# Patient Record
Sex: Female | Born: 1990 | Race: Black or African American | Hispanic: No | Marital: Married | Smoking: Never smoker
Health system: Southern US, Community
[De-identification: ages and names within clinical notes are randomized; demographics above are authoritative.]

## PROBLEM LIST (undated history)

## (undated) DIAGNOSIS — O139 Gestational [pregnancy-induced] hypertension without significant proteinuria, unspecified trimester: Secondary | ICD-10-CM

## (undated) DIAGNOSIS — J302 Other seasonal allergic rhinitis: Secondary | ICD-10-CM

## (undated) HISTORY — PX: NO PAST SURGERIES: SHX2092

## (undated) HISTORY — DX: Gestational (pregnancy-induced) hypertension without significant proteinuria, unspecified trimester: O13.9

---

## 2011-08-13 ENCOUNTER — Emergency Department (INDEPENDENT_AMBULATORY_CARE_PROVIDER_SITE_OTHER)
Admission: EM | Admit: 2011-08-13 | Discharge: 2011-08-13 | Disposition: A | Discharge status: Home / Self Care | Payer: PRIVATE HEALTH INSURANCE | Source: Home / Self Care | Attending: Emergency Medicine | Admitting: Emergency Medicine

## 2011-08-13 ENCOUNTER — Encounter (HOSPITAL_COMMUNITY): Payer: Self-pay | Admitting: *Deleted

## 2011-08-13 DIAGNOSIS — Z331 Pregnant state, incidental: Secondary | ICD-10-CM

## 2011-08-13 HISTORY — DX: Other seasonal allergic rhinitis: J30.2

## 2011-08-13 MED ORDER — PRENATAL VITAMINS (DIS) PO TABS: 1.0000 | ORAL_TABLET | ORAL | Status: DC

## 2011-08-13 NOTE — ED Provider Notes (Signed)
History     CSN: [id number]  Arrival date & time 08/13/11  1348   First MD Initiated Contact with Patient 08/13/11 1428      Chief Complaint  Patient presents with  . Possible Pregnancy    (Consider location/radiation/quality/duration/timing/severity/associated sxs/prior treatment) HPI Comments: I have had a positive test at home and my last menstrual period was March 18, want to confirm that I am pregnant. Somewhat nauseous. No vaginal bleeding and normal, pain.  The history is provided by the patient.    Past Medical History  Diagnosis Date  . Seasonal allergies     History reviewed. No pertinent past surgical history.  History reviewed. No pertinent family history.  History  Substance Use Topics  . Smoking status: Never Smoker   . Smokeless tobacco: Not on file  . Alcohol Use: Yes    OB History    Grav Para Term Preterm Abortions TAB SAB Ect Mult Living                  Review of Systems  Constitutional: Positive for appetite change. Negative for fever and fatigue.  Gastrointestinal: Negative for abdominal pain.  Genitourinary: Negative for dysuria, vaginal bleeding, pelvic pain and dyspareunia.    Allergies  Review of patient's allergies indicates no known allergies.  Home Medications   Current Outpatient Rx  Name Route Sig Dispense Refill  . DIPHENHYDRAMINE HCL 25 MG PO CAPS Oral Take 25 mg by mouth 1 day or 1 dose.    Marland Kitchen PRENATAL VITAMINS (DIS) PO TABS Oral Take 1 tablet by mouth 1 day or 1 dose. 30 tablet 0    BP 137/87  Pulse 88  Temp(Src) 97.1 F (36.2 C) (Oral)  Resp 18  SpO2 100%  LMP 07/07/2011  Physical Exam  Nursing note and vitals reviewed. Constitutional: She appears well-developed and well-nourished. No distress.  Neurological: She is alert.  Psychiatric: She has a normal mood and affect. Her behavior is normal.    ED Course  Procedures (including critical care time)  Labs Reviewed  POCT PREGNANCY, URINE - Abnormal; Notable  for the following:    Preg Test, Ur POSITIVE (*)    All other components within normal limits   No results found.   1. Pregnancy as incidental finding       MDM   Pregnancy first trimester. Patient undecided whether this is a pregnancy she will pursue have been given referrals for both continuity of care pre-and establishment of prenatal care versus early termination of pregnancy.       Jimmie Molly, MD 08/13/11 [phone]

## 2011-08-13 NOTE — Discharge Instructions (Signed)
ABCs of Pregnancy A Antepartum care is very important. Be sure you see your doctor and get prenatal care as soon as you think you are pregnant. At this time, you will be tested for infection, genetic abnormalities and potential problems with you and the pregnancy. This is the time to discuss diet, exercise, work, medications, labor, pain medication during labor and the possibility of a cesarean delivery. Ask any questions that may concern you. It is important to see your doctor regularly throughout your pregnancy. Avoid exposure to toxic substances and chemicals - such as cleaning solvents, lead and mercury, some insecticides, and paint. Pregnant women should avoid exposure to paint fumes, and fumes that cause you to feel ill, dizzy or faint. When possible, it is a good idea to have a pre-pregnancy consultation with your caregiver to begin some important recommendations your caregiver suggests such as, taking folic acid, exercising, quitting smoking, avoiding alcoholic beverages, etc. B Breastfeeding is the healthiest choice for both you and your baby. It has many nutritional benefits for the baby and health benefits for the mother. It also creates a very tight and loving bond between the baby and mother. Talk to your doctor, your family and friends, and your employer about how you choose to feed your baby and how they can support you in your decision. Not all birth defects can be prevented, but a woman can take actions that may increase her chance of having a healthy baby. Many birth defects happen very early in pregnancy, sometimes before a woman even knows she is pregnant. Birth defects or abnormalities of any child in your or the father's family should be discussed with your caregiver. Get a good support bra as your breast size changes. Wear it especially when you exercise and when nursing.  C Celebrate the news of your pregnancy with the your spouse/father and family. Childbirth classes are helpful to  take for you and the spouse/father because it helps to understand what happens during the pregnancy, labor and delivery. Cesarean delivery should be discussed with your doctor so you are prepared for that possibility. The pros and cons of circumcision if it is a boy, should be discussed with your pediatrician. Cigarette smoking during pregnancy can result in low birth weight babies. It has been associated with infertility, miscarriages, tubal pregnancies, infant death (mortality) and poor health (morbidity) in childhood. Additionally, cigarette smoking may cause long-term learning disabilities. If you smoke, you should try to quit before getting pregnant and not smoke during the pregnancy. Secondary smoke may also harm a mother and her developing baby. It is a good idea to ask people to stop smoking around you during your pregnancy and after the baby is born. Extra calcium is necessary when you are pregnant and is found in your prenatal vitamin, in dairy products, green leafy vegetables and in calcium supplements. D A healthy diet according to your current weight and height, along with vitamins and mineral supplements should be discussed with your caregiver. Domestic abuse or violence should be made known to your doctor right away to get the situation corrected. Drink more water when you exercise to keep hydrated. Discomfort of your back and legs usually develops and progresses from the middle of the second trimester through to delivery of the baby. This is because of the enlarging baby and uterus, which may also affect your balance. Do not take illegal drugs. Illegal drugs can seriously harm the baby and you. Drink extra fluids (water is best) throughout pregnancy to help   your body keep up with the increases in your blood volume. Drink at least 6 to 8 glasses of water, fruit juice, or milk each day. A good way to know you are drinking enough fluid is when your urine looks almost like clear water or is very light  yellow.  E Eat healthy to get the nutrients you and your unborn baby need. Your meals should include the five basic food groups. Exercise (30 minutes of light to moderate exercise a day) is important and encouraged during pregnancy, if there are no medical problems or problems with the pregnancy. Exercise that causes discomfort or dizziness should be stopped and reported to your caregiver. Emotions during pregnancy can change from being ecstatic to depression and should be understood by you, your partner and your family. F Fetal screening with ultrasound, amniocentesis and monitoring during pregnancy and labor is common and sometimes necessary. Take 400 micrograms of folic acid daily both before, when possible, and during the first few months of pregnancy to reduce the risk of birth defects of the brain and spine. All women who could possibly become pregnant should take a vitamin with folic acid, every day. It is also important to eat a healthy diet with fortified foods (enriched grain products, including cereals, rice, breads, and pastas) and foods with natural sources of folate (orange juice, green leafy vegetables, beans, peanuts, broccoli, asparagus, peas, and lentils). The father should be involved with all aspects of the pregnancy including, the prenatal care, childbirth classes, labor, delivery, and postpartum time. Fathers may also have emotional concerns about being a father, financial needs, and raising a family. G Genetic testing should be done appropriately. It is important to know your family and the father's history. If there have been problems with pregnancies or birth defects in your family, report these to your doctor. Also, genetic counselors can talk with you about the information you might need in making decisions about having a family. You can call a major medical center in your area for help in finding a board-certified genetic counselor. Genetic testing and counseling should be done  before pregnancy when possible, especially if there is a history of problems in the mother's or father's family. Certain ethnic backgrounds are more at risk for genetic defects. H Get familiar with the hospital where you will be having your baby. Get to know how long it takes to get there, the labor and delivery area, and the hospital procedures. Be sure your medical insurance is accepted there. Get your home ready for the baby including, clothes, the baby's room (when possible), furniture and car seat. Hand washing is important throughout the day, especially after handling raw meat and poultry, changing the baby's diaper or using the bathroom. This can help prevent the spread of many bacteria and viruses that cause infection. Your hair may become dry and thinner, but will return to normal a few weeks after the baby is born. Heartburn is a common problem that can be treated by taking antacids recommended by your caregiver, eating smaller meals 5 or 6 times a day, not drinking liquids when eating, drinking between meals and raising the head of your bed 2 to 3 inches. I Insurance to cover you, the baby, doctor and hospital should be reviewed so that you will be prepared to pay any costs not covered by your insurance plan. If you do not have medical insurance, there are usually clinics and services available for you in your community. Take 30 milligrams of iron during   your pregnancy as prescribed by your doctor to reduce the risk of low red blood cells (anemia) later in pregnancy. All women of childbearing age should eat a diet rich in iron. J There should be a joint effort for the mother, father and any other children to adapt to the pregnancy financially, emotionally, and psychologically during the pregnancy. Join a support group for moms-to-be. Or, join a class on parenting or childbirth. Have the family participate when possible. K Know your limits. Let your caregiver know if you experience any of the  following:   Pain of any kind.   Strong cramps.   You develop a lot of weight in a short period of time (5 pounds in 3 to 5 days).   Vaginal bleeding, leaking of amniotic fluid.   Headache, vision problems.   Dizziness, fainting, shortness of breath.   Chest pain.   Fever of 102 F (38.9 C) or higher.   Gush of clear fluid from your vagina.   Painful urination.   Domestic violence.   Irregular heartbeat (palpitations).   Rapid beating of the heart (tachycardia).   Constant feeling sick to your stomach (nauseous) and vomiting.   Trouble walking, fluid retention (edema).   Muscle weakness.   If your baby has decreased activity.   Persistent diarrhea.   Abnormal vaginal discharge.   Uterine contractions at 20-minute intervals.   Back pain that travels down your leg.  L Learn and practice that what you eat and drink should be in moderation and healthy for you and your baby. Legal drugs such as alcohol and caffeine are important issues for pregnant women. There is no safe amount of alcohol a woman can drink while pregnant. Fetal alcohol syndrome, a disorder characterized by growth retardation, facial abnormalities, and central nervous system dysfunction, is caused by a woman's use of alcohol during pregnancy. Caffeine, found in tea, coffee, soft drinks and chocolate, should also be limited. Be sure to read labels when trying to cut down on caffeine during pregnancy. More than 200 foods, beverages, and over-the-counter medications contain caffeine and have a high salt content! There are coffees and teas that do not contain caffeine. M Medical conditions such as diabetes, epilepsy, and high blood pressure should be treated and kept under control before pregnancy when possible, but especially during pregnancy. Ask your caregiver about any medications that may need to be changed or adjusted during pregnancy. If you are currently taking any medications, ask your caregiver if it  is safe to take them while you are pregnant or before getting pregnant when possible. Also, be sure to discuss any herbs or vitamins you are taking. They are medicines, too! Discuss with your doctor all medications, prescribed and over-the-counter, that you are taking. During your prenatal visit, discuss the medications your doctor may give you during labor and delivery. N Never be afraid to ask your doctor or caregiver questions about your health, the progress of the pregnancy, family problems, stressful situations, and recommendation for a pediatrician, if you do not have one. It is better to take all precautions and discuss any questions or concerns you may have during your office visits. It is a good idea to write down your questions before you visit the doctor. O Over-the-counter cough and cold remedies may contain alcohol or other ingredients that should be avoided during pregnancy. Ask your caregiver about prescription, herbs or over-the-counter medications that you are taking or may consider taking while pregnant.  P Physical activity during pregnancy can   benefit both you and your baby by lessening discomfort and fatigue, providing a sense of well-being, and increasing the likelihood of early recovery after delivery. Light to moderate exercise during pregnancy strengthens the belly (abdominal) and back muscles. This helps improve posture. Practicing yoga, walking, swimming, and cycling on a stationary bicycle are usually safe exercises for pregnant women. Avoid scuba diving, exercise at high altitudes (over 3000 feet), skiing, horseback riding, contact sports, etc. Always check with your doctor before beginning any kind of exercise, especially during pregnancy and especially if you did not exercise before getting pregnant. Q Queasiness, stomach upset and morning sickness are common during pregnancy. Eating a couple of crackers or dry toast before getting out of bed. Foods that you normally love may  make you feel sick to your stomach. You may need to substitute other nutritious foods. Eating 5 or 6 small meals a day instead of 3 large ones may make you feel better. Do not drink with your meals, drink between meals. Questions that you have should be written down and asked during your prenatal visits. R Read about and make plans to baby-proof your home. There are important tips for making your home a safer environment for your baby. Review the tips and make your home safer for you and your baby. Read food labels regarding calories, salt and fat content in the food. S Saunas, hot tubs, and steam rooms should be avoided while you are pregnant. Excessive high heat may be harmful during your pregnancy. Your caregiver will screen and examine you for sexually transmitted diseases and genetic disorders during your prenatal visits. Learn the signs of labor. Sexual relations while pregnant is safe unless there is a medical or pregnancy problem and your caregiver advises against it. T Traveling long distances should be avoided especially in the third trimester of your pregnancy. If you do have to travel out of state, be sure to take a copy of your medical records and medical insurance plan with you. You should not travel long distances without seeing your doctor first. Most airlines will not allow you to travel after 36 weeks of pregnancy. Toxoplasmosis is an infection caused by a parasite that can seriously harm an unborn baby. Avoid eating undercooked meat and handling cat litter. Be sure to wear gloves when gardening. Tingling of the hands and fingers is not unusual and is due to fluid retention. This will go away after the baby is born. U Womb (uterus) size increases during the first trimester. Your kidneys will begin to function more efficiently. This may cause you to feel the need to urinate more often. You may also leak urine when sneezing, coughing or laughing. This is due to the growing uterus pressing  against your bladder, which lies directly in front of and slightly under the uterus during the first few months of pregnancy. If you experience burning along with frequency of urination or bloody urine, be sure to tell your doctor. The size of your uterus in the third trimester may cause a problem with your balance. It is advisable to maintain good posture and avoid wearing high heels during this time. An ultrasound of your baby may be necessary during your pregnancy and is safe for you and your baby. V Vaccinations are an important concern for pregnant women. Get needed vaccines before pregnancy. Center for Disease Control (www.cdc.gov) has clear guidelines for the use of vaccines during pregnancy. Review the list, be sure to discuss it with your doctor. Prenatal vitamins are helpful   and healthy for you and the baby. Do not take extra vitamins except what is recommended. Taking too much of certain vitamins can cause overdose problems. Continuous vomiting should be reported to your caregiver. Varicose veins may appear especially if there is a family history of varicose veins. They should subside after the delivery of the baby. Support hose helps if there is leg discomfort. W Being overweight or underweight during pregnancy may cause problems. Try to get within 15 pounds of your ideal weight before pregnancy. Remember, pregnancy is not a time to be dieting! Do not stop eating or start skipping meals as your weight increases. Both you and your baby need the calories and nutrition you receive from a healthy diet. Be sure to consult with your doctor about your diet. There is a formula and diet plan available depending on whether you are overweight or underweight. Your caregiver or nutritionist can help and advise you if necessary. X Avoid X-rays. If you must have dental work or diagnostic tests, tell your dentist or physician that you are pregnant so that extra care can be taken. X-rays should only be taken when  the risks of not taking them outweigh the risk of taking them. If needed, only the minimum amount of radiation should be used. When X-rays are necessary, protective lead shields should be used to cover areas of the body that are not being X-rayed. Y Your baby loves you. Breastfeeding your baby creates a loving and very close bond between the two of you. Give your baby a healthy environment to live in while you are pregnant. Infants and children require constant care and guidance. Their health and safety should be carefully watched at all times. After the baby is born, rest or take a nap when the baby is sleeping. Z Get your ZZZs. Be sure to get plenty of rest. Resting on your side as often as possible, especially on your left side is advised. It provides the best circulation to your baby and helps reduce swelling. Try taking a nap for 30 to 45 minutes in the afternoon when possible. After the baby is born rest or take a nap when the baby is sleeping. Try elevating your feet for that amount of time when possible. It helps the circulation in your legs and helps reduce swelling.  Most information courtesy of the CDC. Document Released: 04/07/2005 Document Revised: 03/27/2011 Document Reviewed: 12/20/2008 [employer] Patient Information 2012 [employer]. 

## 2011-08-13 NOTE — ED Notes (Signed)
Pt requesting pregnancy test - took home test positive -  wants confirmation - LMP 3/18

## 2011-08-19 ENCOUNTER — Inpatient Hospital Stay (HOSPITAL_COMMUNITY)
Admission: AD | Admit: 2011-08-19 | Discharge: 2011-08-19 | Disposition: A | Discharge status: Home / Self Care | Payer: No Typology Code available for payment source | Source: Ambulatory Visit | Attending: Obstetrics & Gynecology | Admitting: Obstetrics & Gynecology

## 2011-08-19 ENCOUNTER — Encounter (HOSPITAL_COMMUNITY): Payer: Self-pay | Admitting: *Deleted

## 2011-08-19 ENCOUNTER — Inpatient Hospital Stay (HOSPITAL_COMMUNITY): Payer: No Typology Code available for payment source

## 2011-08-19 DIAGNOSIS — R109 Unspecified abdominal pain: Secondary | ICD-10-CM | POA: Insufficient documentation

## 2011-08-19 DIAGNOSIS — O209 Hemorrhage in early pregnancy, unspecified: Secondary | ICD-10-CM | POA: Insufficient documentation

## 2011-08-19 LAB — HCG, QUANTITATIVE, PREGNANCY: hCG, Beta Chain, Quant, S: 6324 m[IU]/mL — ABNORMAL HIGH (ref <5)

## 2011-08-19 LAB — CBC
HCT: 35.3 % — ABNORMAL LOW (ref 36.0–46.0)
Hemoglobin: 11.9 g/dL — ABNORMAL LOW (ref 12.0–15.0)
MCH: 29.9 pg (ref 26.0–34.0)
MCHC: 33.7 g/dL (ref 30.0–36.0)
MCV: 88.7 fL (ref 78.0–100.0)
RDW: 12.1 % (ref 11.5–15.5)

## 2011-08-19 LAB — URINALYSIS, ROUTINE W REFLEX MICROSCOPIC
Bilirubin Urine: NEGATIVE
Glucose, UA: NEGATIVE mg/dL
Ketones, ur: 15 mg/dL — AB
Protein, ur: NEGATIVE mg/dL
pH: 7 (ref 5.0–8.0)

## 2011-08-19 LAB — WET PREP, GENITAL

## 2011-08-19 NOTE — MAU Note (Signed)
Patient state she had a positive pregnancy test at [facility] ED. States she started having cramping and light spotting last night. Has had a white vaginal discharge with a slight odor for a couple of days. Denies any pain at this time. Has not checked for discharge since this am.

## 2011-08-19 NOTE — MAU Note (Signed)
Pt states she started having cramping yesterday ,no vaginal bleeding noted

## 2011-08-19 NOTE — MAU Provider Note (Signed)
History     CSN: [id number]  Arrival date and time: 08/19/11 1455   First Provider Initiated Contact with Patient 08/19/11 1620      Chief Complaint  Patient presents with  . Abdominal Cramping   HPI  Pt is pregnant with LMP 3/18- [redacted]w[redacted]d pregnant.  She complains of pink vaginal discharge and some lower abdominal cramping since yesterday.  She denies UTI symptoms, constipation, or diarrhea.  She has been  Nauseated.  Pt plans care at [facility].  Past Medical History  Diagnosis Date  . Seasonal allergies   . Asthma     Past Surgical History  Procedure Date  . No past surgeries     Family History  Problem Relation Age of Onset  . Hypertension Father   . Diabetes Paternal Grandfather   . Hypertension Paternal Grandfather     History  Substance Use Topics  . Smoking status: Never Smoker   . Smokeless tobacco: Not on file  . Alcohol Use: Yes    Allergies: No Known Allergies  Prescriptions prior to admission  Medication Sig Dispense Refill  . diphenhydrAMINE (BENADRYL) 25 mg capsule Take 25 mg by mouth 1 day or 1 dose.      . Prenatal Vitamins (DIS) TABS Take 1 tablet by mouth 1 day or 1 dose.  30 tablet  0    Review of Systems  Constitutional: Negative for fever.  Respiratory: Negative for cough.   Gastrointestinal: Positive for nausea and abdominal pain. Negative for vomiting, diarrhea and constipation.  Genitourinary: Negative for dysuria, urgency and frequency.   Physical Exam   Blood pressure 126/68, pulse 78, temperature 99.1 F (37.3 C), temperature source Oral, resp. rate 16, height 5' 2.5" (1.588 m), weight 211 lb 9.6 oz (95.981 kg), last menstrual period 07/07/2011, SpO2 100.00%.  Physical Exam  Nursing note and vitals reviewed. Constitutional: She is oriented to person, place, and time. She appears well-developed and well-nourished.  HENT:  Head: Normocephalic.  Eyes: Pupils are equal, round, and reactive to light.  Neck: Normal range of motion. Neck  supple.  Cardiovascular: Normal rate.   Respiratory: Effort normal.  GI: Soft. She exhibits no distension. There is no tenderness. There is no rebound and no guarding.       Pain not replicated with exam- not currently present  Genitourinary:       Scant amount of pink discharge on Q-tip from os; no bleeding noted in vault; uterus NSSC NT; adnexal without palpable enlargement or tender  Musculoskeletal: Normal range of motion.  Neurological: She is alert and oriented to person, place, and time.  Skin: Skin is warm and dry.  Psychiatric: She has a normal mood and affect.    MAU Course  Procedures Results for orders placed during the hospital encounter of 08/19/11 (from the past 24 hour(s))  URINALYSIS, ROUTINE W REFLEX MICROSCOPIC     Status: Abnormal   Collection Time   08/19/11  3:50 PM      Component Value Range   Color, Urine YELLOW  YELLOW    APPearance HAZY (*) CLEAR    Specific Gravity, Urine 1.020  1.005 - 1.030    pH 7.0  5.0 - 8.0    Glucose, UA NEGATIVE  NEGATIVE (mg/dL)   Hgb urine dipstick NEGATIVE  NEGATIVE    Bilirubin Urine NEGATIVE  NEGATIVE    Ketones, ur 15 (*) NEGATIVE (mg/dL)   Protein, ur NEGATIVE  NEGATIVE (mg/dL)   Urobilinogen, UA 1.0  0.0 - 1.0 (mg/dL)  Nitrite NEGATIVE  NEGATIVE    Leukocytes, UA NEGATIVE  NEGATIVE   CBC     Status: Abnormal   Collection Time   08/19/11  4:28 PM      Component Value Range   WBC 7.6  4.0 - 10.5 (K/uL)   RBC 3.98  3.87 - 5.11 (MIL/uL)   Hemoglobin 11.9 (*) 12.0 - 15.0 (g/dL)   HCT 65.7 (*) 84.6 - 46.0 (%)   MCV 88.7  78.0 - 100.0 (fL)   MCH 29.9  26.0 - 34.0 (pg)   MCHC 33.7  30.0 - 36.0 (g/dL)   RDW 96.2  95.2 - 84.1 (%)   Platelets 230  150 - 400 (K/uL)  ABO/RH     Status: Normal (Preliminary result)   Collection Time   08/19/11  4:29 PM      Component Value Range   ABO/RH(D) O POS    WET PREP, GENITAL     Status: Abnormal   Collection Time   08/19/11  4:40 PM      Component Value Range   Yeast Wet Prep HPF  POC NONE SEEN  NONE SEEN    Trich, Wet Prep NONE SEEN  NONE SEEN    Clue Cells Wet Prep HPF POC NONE SEEN  NONE SEEN    WBC, Wet Prep HPF POC FEW (*) NONE SEEN   GC/Chlamydia- pending Clinical Data: Cramps, vaginal discharge, pregnant; no quantitative  beta HCG available for correlation  OBSTETRIC <14 WK [place] AND TRANSVAGINAL OB US  Technique: Both transabdominal and transvaginal ultrasound  examinations were performed for complete evaluation of the  gestation as well as the maternal uterus, adnexal regions, and  pelvic cul-de-sac. Transvaginal technique was performed to assess  early pregnancy.  Comparison: None  Intrauterine gestational sac: Visualized/normal in shape.  Yolk sac: Present  Embryo: Not identified  Cardiac Activity: N/A  Heart Rate: N/A bpm  MSD: 8.9 mm 5 w 4 d  [place] EDC: 04/16/2029  Maternal uterus/adnexae:  No subchorionic hemorrhage.  Left ovary measures 2.5 x 4.5 x 2.8 cm in size and contains a small  collapsed versus hemorrhagic corpus luteal cyst.  Right ovary normal size and morphology, 1.5 x 3.3 x 1.5 cm.  No adnexal masses or free pelvic fluid otherwise identified.  IMPRESSION:  Gestational sac identified within the uterus containing a small  yolk sac, though no fetal pole is identified to establish fetal  viability.  Otherwise negative exam.  Original Report Authenticated By: Lollie Marrow,  Will f/u with viability ultrasound in 10 days Results for orders placed during the hospital encounter of 08/19/11 (from the past 24 hour(s))  URINALYSIS, ROUTINE W REFLEX MICROSCOPIC     Status: Abnormal   Collection Time   08/19/11  3:50 PM      Component Value Range   Color, Urine YELLOW  YELLOW    APPearance HAZY (*) CLEAR    Specific Gravity, Urine 1.020  1.005 - 1.030    pH 7.0  5.0 - 8.0    Glucose, UA NEGATIVE  NEGATIVE (mg/dL)   Hgb urine dipstick NEGATIVE  NEGATIVE    Bilirubin Urine NEGATIVE  NEGATIVE    Ketones, ur 15 (*) NEGATIVE (mg/dL)   Protein,  ur NEGATIVE  NEGATIVE (mg/dL)   Urobilinogen, UA 1.0  0.0 - 1.0 (mg/dL)   Nitrite NEGATIVE  NEGATIVE    Leukocytes, UA NEGATIVE  NEGATIVE   CBC     Status: Abnormal   Collection Time   08/19/11  4:28 PM      Component Value Range   WBC 7.6  4.0 - 10.5 (K/uL)   RBC 3.98  3.87 - 5.11 (MIL/uL)   Hemoglobin 11.9 (*) 12.0 - 15.0 (g/dL)   HCT 16.1 (*) 09.6 - 46.0 (%)   MCV 88.7  78.0 - 100.0 (fL)   MCH 29.9  26.0 - 34.0 (pg)   MCHC 33.7  30.0 - 36.0 (g/dL)   RDW 04.5  40.9 - 81.1 (%)   Platelets 230  150 - 400 (K/uL)  ABO/RH     Status: Normal   Collection Time   08/19/11  4:29 PM      Component Value Range   ABO/RH(D) O POS    WET PREP, GENITAL     Status: Abnormal   Collection Time   08/19/11  4:40 PM      Component Value Range   Yeast Wet Prep HPF POC NONE SEEN  NONE SEEN    Trich, Wet Prep NONE SEEN  NONE SEEN    Clue Cells Wet Prep HPF POC NONE SEEN  NONE SEEN    WBC, Wet Prep HPF POC FEW (*) NONE SEEN   HCG, QUANTITATIVE, PREGNANCY     Status: Abnormal   Collection Time   08/19/11  5:00 PM      Component Value Range   hCG, Beta Chain, Quant, S 6324 (*) <5 (mIU/mL)   Assessment and Plan  bleeding in early pregnancyLINEBERRY,Lotus Santillo 08/19/2011, 4:21 PM  Will f/u for ultrasound in 10 days for viability

## 2011-08-19 NOTE — Progress Notes (Signed)
Speculum exam done cultures obtained 

## 2011-08-20 LAB — GC/CHLAMYDIA PROBE AMP, GENITAL
Chlamydia, DNA Probe: NEGATIVE
GC Probe Amp, Genital: NEGATIVE

## 2011-09-30 LAB — OB RESULTS CONSOLE RUBELLA ANTIBODY, IGM: Rubella: IMMUNE

## 2012-03-17 ENCOUNTER — Other Ambulatory Visit (HOSPITAL_COMMUNITY): Payer: Self-pay | Admitting: Obstetrics and Gynecology

## 2012-03-17 DIAGNOSIS — O3660X0 Maternal care for excessive fetal growth, unspecified trimester, not applicable or unspecified: Secondary | ICD-10-CM

## 2012-03-24 ENCOUNTER — Ambulatory Visit (HOSPITAL_COMMUNITY)
Admission: RE | Admit: 2012-03-24 | Discharge: 2012-03-24 | Disposition: A | Discharge status: Home / Self Care | Payer: No Typology Code available for payment source | Source: Ambulatory Visit | Attending: Obstetrics and Gynecology | Admitting: Obstetrics and Gynecology

## 2012-03-24 ENCOUNTER — Encounter (HOSPITAL_COMMUNITY): Payer: Self-pay

## 2012-03-24 DIAGNOSIS — O3660X0 Maternal care for excessive fetal growth, unspecified trimester, not applicable or unspecified: Secondary | ICD-10-CM | POA: Insufficient documentation

## 2012-03-24 DIAGNOSIS — Z3689 Encounter for other specified antenatal screening: Secondary | ICD-10-CM | POA: Insufficient documentation

## 2012-03-28 ENCOUNTER — Inpatient Hospital Stay (HOSPITAL_COMMUNITY)
Admission: AD | Admit: 2012-03-28 | Discharge: 2012-03-28 | Disposition: A | Discharge status: Home / Self Care | Payer: Medicaid Other | Source: Ambulatory Visit | Attending: Obstetrics and Gynecology | Admitting: Obstetrics and Gynecology

## 2012-03-28 ENCOUNTER — Encounter (HOSPITAL_COMMUNITY): Payer: Self-pay | Admitting: *Deleted

## 2012-03-28 DIAGNOSIS — O99891 Other specified diseases and conditions complicating pregnancy: Secondary | ICD-10-CM | POA: Insufficient documentation

## 2012-03-28 LAB — POCT FERN TEST: POCT Fern Test: NEGATIVE

## 2012-03-28 NOTE — Progress Notes (Signed)
Dr Tenny Craw notified of pt's fern results, VE and fhr pattern, orders received

## 2012-03-28 NOTE — MAU Provider Note (Signed)
  History     CSN: [id number]  Arrival date and time: 03/28/12 1425   None     Chief Complaint  Patient presents with  . Rupture of Membranes   HPI [name] is 21 y.o. G1P0 [redacted]w[redacted]d weeks presenting with ?rupture of membranes.  States she had a gush of fluid this am.  Denies contractions, vaginal bleeding.  + Fetal movement.  Patient of [facility].    Past Medical History  Diagnosis Date  . Seasonal allergies   . Asthma     Past Surgical History  Procedure Date  . No past surgeries     Family History  Problem Relation Age of Onset  . Hypertension Father   . Diabetes Paternal Grandfather   . Hypertension Paternal Grandfather     History  Substance Use Topics  . Smoking status: Never Smoker   . Smokeless tobacco: Not on file  . Alcohol Use: Yes    Allergies: No Known Allergies  Prescriptions prior to admission  Medication Sig Dispense Refill  . albuterol (PROVENTIL HFA;VENTOLIN HFA) 108 (90 BASE) MCG/ACT inhaler Inhale 2 puffs into the lungs daily as needed. Before exercise or as needed for asthma      . diphenhydrAMINE (BENADRYL) 25 mg capsule Take 25 mg by mouth 2 (two) times daily as needed. For allergies      . Prenatal Vit-Fe Fumarate-FA (PRENATAL MULTIVITAMIN) TABS Take 1 tablet by mouth daily.      . pseudoephedrine (SUDAFED) 30 MG tablet Take 30 mg by mouth every 4 (four) hours as needed. For sinus congestion        Review of Systems  Genitourinary:       ? ROM Neg for vaginal bleeding   Physical Exam   Blood pressure 146/100, pulse 105, temperature 98.2 F (36.8 C), resp. rate 16, height 5\' 6"  (1.676 m), weight 252 lb (114.306 kg), last menstrual period 07/12/2011.  Physical Exam  Constitutional: She is oriented to person, place, and time. She appears well-developed and well-nourished. No distress.  HENT:  Head: Normocephalic.  Neck: Normal range of motion.  Respiratory: Effort normal.  GI: Soft.  Genitourinary:   Vagina-without pooling of fluid, blood, or abnormal appearing vaginal discharge.   CERVICAL EXAM by Ginger, RN 1cm, 50% effaced.  Neurological: She is alert and oriented to person, place, and time.  Skin: Skin is warm and dry.  Psychiatric: She has a normal mood and affect. Her behavior is normal.  FERN- NEGATIVE  MAU Course  Procedures  MDM Ginger to call Dr. Tenny Craw to report exam and fern results.  Assessment and Plan  A;  [redacted]w[redacted]d gestation      Rupture of membranes ruled out with Negative Fern  P:  Per Dr. Fenton Foy M 03/28/2012, 3:23 PM

## 2012-03-28 NOTE — MAU Note (Signed)
Pt states she noticed leakage of fluid starting at 145 today, clear fluid.

## 2012-03-29 ENCOUNTER — Inpatient Hospital Stay (HOSPITAL_COMMUNITY)
Admission: AD | Admit: 2012-03-29 | Discharge: 2012-04-03 | DRG: 765 | Disposition: A | Discharge status: Home / Self Care | Payer: Medicaid Other | Source: Ambulatory Visit | Attending: Obstetrics & Gynecology | Admitting: Obstetrics & Gynecology

## 2012-03-29 ENCOUNTER — Encounter (HOSPITAL_COMMUNITY): Payer: Self-pay | Admitting: *Deleted

## 2012-03-29 DIAGNOSIS — O411090 Infection of amniotic sac and membranes, unspecified, unspecified trimester, not applicable or unspecified: Secondary | ICD-10-CM | POA: Diagnosis present

## 2012-03-29 DIAGNOSIS — O99892 Other specified diseases and conditions complicating childbirth: Secondary | ICD-10-CM | POA: Diagnosis present

## 2012-03-29 DIAGNOSIS — Z22330 Carrier of Group B streptococcus: Secondary | ICD-10-CM

## 2012-03-29 DIAGNOSIS — O4290 Premature rupture of membranes, unspecified as to length of time between rupture and onset of labor, unspecified weeks of gestation: Secondary | ICD-10-CM | POA: Diagnosis present

## 2012-03-29 LAB — CBC
HCT: 31.1 % — ABNORMAL LOW (ref 36.0–46.0)
Hemoglobin: 10.2 g/dL — ABNORMAL LOW (ref 12.0–15.0)
MCH: 29.6 pg (ref 26.0–34.0)
MCHC: 33.4 g/dL (ref 30.0–36.0)
MCHC: 33.1 g/dL (ref 30.0–36.0)
MCV: 88.6 fL (ref 78.0–100.0)
Platelets: 129 10*3/uL — ABNORMAL LOW (ref 150–400)
RDW: 14.2 % (ref 11.5–15.5)
RDW: 14 % (ref 11.5–15.5)
WBC: 10.9 10*3/uL — ABNORMAL HIGH (ref 4.0–10.5)

## 2012-03-29 LAB — COMPREHENSIVE METABOLIC PANEL
ALT: 76 U/L — ABNORMAL HIGH (ref 0–35)
AST: 70 U/L — ABNORMAL HIGH (ref 0–37)
Albumin: 2.8 g/dL — ABNORMAL LOW (ref 3.5–5.2)
Calcium: 8.5 mg/dL (ref 8.4–10.5)
Chloride: 103 mEq/L (ref 96–112)
Creatinine, Ser: 0.61 mg/dL (ref 0.50–1.10)
Sodium: 135 mEq/L (ref 135–145)

## 2012-03-29 LAB — POCT FERN TEST: POCT Fern Test: POSITIVE

## 2012-03-29 LAB — LACTATE DEHYDROGENASE: LDH: 463 U/L — ABNORMAL HIGH (ref 94–250)

## 2012-03-29 LAB — URIC ACID: Uric Acid, Serum: 4.8 mg/dL (ref 2.4–7.0)

## 2012-03-29 LAB — PREPARE RBC (CROSSMATCH)

## 2012-03-29 MED ORDER — ONDANSETRON HCL 4 MG/2ML IJ SOLN: 4.0000 mg | Freq: Four times a day (QID) | INTRAMUSCULAR | Status: DC | PRN

## 2012-03-29 MED ORDER — PENICILLIN G POTASSIUM 5000000 UNITS IJ SOLR
5.0000 10*6.[IU] | Freq: Once | INTRAVENOUS | Status: AC
  Administered 2012-03-29: 5 10*6.[IU] via INTRAVENOUS
  Filled 2012-03-29: qty 5

## 2012-03-29 MED ORDER — MISOPROSTOL 25 MCG QUARTER TABLET
50.0000 ug | ORAL_TABLET | Freq: Once | ORAL | Status: AC
  Administered 2012-03-29: 50 ug via VAGINAL
  Filled 2012-03-29: qty 0.5

## 2012-03-29 MED ORDER — OXYTOCIN 40 UNITS IN LACTATED RINGERS INFUSION - SIMPLE MED: 1.0000 m[IU]/min | INTRAVENOUS | Status: DC

## 2012-03-29 MED ORDER — MISOPROSTOL 25 MCG QUARTER TABLET
25.0000 ug | ORAL_TABLET | ORAL | Status: DC
  Administered 2012-03-29 – 2012-03-30 (×2): 25 ug via VAGINAL
  Filled 2012-03-29: qty 0.25
  Filled 2012-03-29: qty 1
  Filled 2012-03-29: qty 0.25
  Filled 2012-03-29: qty 1

## 2012-03-29 MED ORDER — OXYTOCIN 40 UNITS IN LACTATED RINGERS INFUSION - SIMPLE MED: 62.5000 mL/h | INTRAVENOUS | Status: DC

## 2012-03-29 MED ORDER — PENICILLIN G POTASSIUM 5000000 UNITS IJ SOLR
2.5000 10*6.[IU] | INTRAVENOUS | Status: DC
  Administered 2012-03-29 – 2012-03-31 (×8): 2.5 10*6.[IU] via INTRAVENOUS
  Filled 2012-03-29 (×12): qty 2.5

## 2012-03-29 MED ORDER — LACTATED RINGERS IV SOLN
INTRAVENOUS | Status: DC
  Administered 2012-03-30 (×2): via INTRAVENOUS

## 2012-03-29 MED ORDER — OXYCODONE-ACETAMINOPHEN 5-325 MG PO TABS
1.0000 | ORAL_TABLET | ORAL | Status: DC | PRN
Start: 2012-03-29 — End: 2012-03-31

## 2012-03-29 MED ORDER — ACETAMINOPHEN 325 MG PO TABS
650.0000 mg | ORAL_TABLET | ORAL | Status: DC | PRN
  Administered 2012-03-31: 650 mg via ORAL
  Filled 2012-03-29: qty 2

## 2012-03-29 MED ORDER — FLEET ENEMA 7-19 GM/118ML RE ENEM: 1.0000 | ENEMA | RECTAL | Status: DC | PRN

## 2012-03-29 MED ORDER — PROMETHAZINE HCL 25 MG/ML IJ SOLN
12.5000 mg | Freq: Once | INTRAMUSCULAR | Status: AC
  Administered 2012-03-29: 12.5 mg via INTRAVENOUS
  Filled 2012-03-29: qty 1

## 2012-03-29 MED ORDER — BUTORPHANOL TARTRATE 1 MG/ML IJ SOLN
1.0000 mg | INTRAMUSCULAR | Status: DC | PRN
  Administered 2012-03-30 (×3): 1 mg via INTRAVENOUS
  Filled 2012-03-29 (×4): qty 1

## 2012-03-29 MED ORDER — OXYTOCIN BOLUS FROM INFUSION: 500.0000 mL | INTRAVENOUS | Status: DC

## 2012-03-29 MED ORDER — TERBUTALINE SULFATE 1 MG/ML IJ SOLN: 0.2500 mg | Freq: Once | INTRAMUSCULAR | Status: AC | PRN

## 2012-03-29 MED ORDER — LACTATED RINGERS IV SOLN: 500.0000 mL | INTRAVENOUS | Status: DC | PRN

## 2012-03-29 MED ORDER — BUTORPHANOL TARTRATE 1 MG/ML IJ SOLN
1.0000 mg | Freq: Once | INTRAMUSCULAR | Status: AC
  Administered 2012-03-29: 1 mg via INTRAVENOUS

## 2012-03-29 MED ORDER — LIDOCAINE HCL (PF) 1 % IJ SOLN: 30.0000 mL | INTRAMUSCULAR | Status: DC | PRN

## 2012-03-29 MED ORDER — IBUPROFEN 600 MG PO TABS: 600.0000 mg | ORAL_TABLET | Freq: Four times a day (QID) | ORAL | Status: DC | PRN

## 2012-03-29 MED ORDER — CITRIC ACID-SODIUM CITRATE 334-500 MG/5ML PO SOLN
30.0000 mL | ORAL | Status: DC | PRN
  Administered 2012-03-31: 30 mL via ORAL
  Filled 2012-03-29: qty 15

## 2012-03-29 NOTE — H&P (Signed)
21 y.o. G1P0  Estimated Date of Delivery: 04/17/12 admitted at 37/[redacted] weeks gestation PROM.  Prenatal Transfer Tool  Maternal Diabetes: No Genetic Screening: Normal Maternal Ultrasounds/Referrals: Normal Fetal Ultrasounds or other Referrals:  None Maternal Substance Abuse:  No Significant Maternal Medications:  None Significant Maternal Lab Results: Lab values include: Group B Strep positive Other Significant Pregnancy Complications:  None  Afebrile, VSS Heart and Lungs: No active disease Abdomen: soft, gravid, EFW AGA. Cervical exam:  1/50 vtx (12/8) Speculum exam on admission: Pooling, Fern +.  Impression: Early term pregnancy with PROM  Plan:  Cytotec tonight, start pitocin in AM on 12/10 or earlier if labor begins.

## 2012-03-29 NOTE — Progress Notes (Signed)
Dr Arlyce Dice notified of pts' ROM, FHR pattern, and contraction pattern, orders received

## 2012-03-29 NOTE — Progress Notes (Signed)
Patient noted to have elevated blood pressures but no severe features (LFT <2x normal, plats >100,000).  She has received an initial cytotec does of 50 mcg per vagina, and is now getting 25 mcg per vagina q 4 h.  Will continue cytotec until 4 am and start IV pitocin at 8 am.  She is receiving Penicillin for GBS prophylaxis.

## 2012-03-29 NOTE — MAU Note (Signed)
Pt complains of large gush of fluid at 215 and states it continues to leak out

## 2012-03-29 NOTE — ED Provider Notes (Signed)
Asked to see patient for Rule out Rupture of membranes  Sterile speculum exam:  + Pooling + Ferning  Unable to see cervix. Digital cervical exam deferred secondary to ROM.

## 2012-03-30 ENCOUNTER — Encounter (HOSPITAL_COMMUNITY): Payer: Self-pay | Admitting: *Deleted

## 2012-03-30 LAB — CBC
HCT: 31.8 % — ABNORMAL LOW (ref 36.0–46.0)
HCT: 31.8 % — ABNORMAL LOW (ref 36.0–46.0)
Hemoglobin: 10.4 g/dL — ABNORMAL LOW (ref 12.0–15.0)
Hemoglobin: 10.5 g/dL — ABNORMAL LOW (ref 12.0–15.0)
MCH: 29.1 pg (ref 26.0–34.0)
MCHC: 32.7 g/dL (ref 30.0–36.0)
MCV: 88.8 fL (ref 78.0–100.0)
RDW: 14.3 % (ref 11.5–15.5)
WBC: 9.9 10*3/uL (ref 4.0–10.5)

## 2012-03-30 LAB — COMPREHENSIVE METABOLIC PANEL
Albumin: 2.7 g/dL — ABNORMAL LOW (ref 3.5–5.2)
BUN: 9 mg/dL (ref 6–23)
Calcium: 8.6 mg/dL (ref 8.4–10.5)
GFR calc Af Amer: >90 mL/min (ref >90)
Glucose, Bld: 66 mg/dL — ABNORMAL LOW (ref 70–99)
Sodium: 132 mEq/L — ABNORMAL LOW (ref 135–145)
Total Protein: 7 g/dL (ref 6.0–8.3)

## 2012-03-30 MED ORDER — FENTANYL 2.5 MCG/ML BUPIVACAINE 1/10 % EPIDURAL INFUSION (WH - ANES)
14.0000 mL/h | INTRAMUSCULAR | Status: DC
  Administered 2012-03-30 – 2012-03-31 (×3): 14 mL/h via EPIDURAL
  Filled 2012-03-30 (×3): qty 125

## 2012-03-30 MED ORDER — DIPHENHYDRAMINE HCL 50 MG/ML IJ SOLN: 12.5000 mg | INTRAMUSCULAR | Status: DC | PRN

## 2012-03-30 MED ORDER — OXYTOCIN 40 UNITS IN LACTATED RINGERS INFUSION - SIMPLE MED
1.0000 m[IU]/min | INTRAVENOUS | Status: DC
  Administered 2012-03-30: 8 m[IU]/min via INTRAVENOUS
  Administered 2012-03-30: 2 m[IU]/min via INTRAVENOUS
  Filled 2012-03-30: qty 1000

## 2012-03-30 MED ORDER — PHENYLEPHRINE 40 MCG/ML (10ML) SYRINGE FOR IV PUSH (FOR BLOOD PRESSURE SUPPORT): 80.0000 ug | PREFILLED_SYRINGE | INTRAVENOUS | Status: DC | PRN

## 2012-03-30 MED ORDER — EPHEDRINE 5 MG/ML INJ
10.0000 mg | INTRAVENOUS | Status: DC | PRN
  Filled 2012-03-30: qty 4

## 2012-03-30 MED ORDER — LACTATED RINGERS IV SOLN: 500.0000 mL | Freq: Once | INTRAVENOUS | Status: DC

## 2012-03-30 MED ORDER — EPHEDRINE 5 MG/ML INJ: 10.0000 mg | INTRAVENOUS | Status: DC | PRN

## 2012-03-30 MED ORDER — SALINE SPRAY 0.65 % NA SOLN
1.0000 | NASAL | Status: DC | PRN
  Administered 2012-03-30: 1 via NASAL
  Filled 2012-03-30: qty 44

## 2012-03-30 MED ORDER — PHENYLEPHRINE 40 MCG/ML (10ML) SYRINGE FOR IV PUSH (FOR BLOOD PRESSURE SUPPORT)
80.0000 ug | PREFILLED_SYRINGE | INTRAVENOUS | Status: DC | PRN
  Filled 2012-03-30: qty 5

## 2012-03-30 MED ORDER — LIDOCAINE HCL (PF) 1 % IJ SOLN
INTRAMUSCULAR | Status: DC | PRN
  Administered 2012-03-30 (×2): 5 mL

## 2012-03-30 NOTE — Progress Notes (Signed)
Repeat CBC/CMP revealed stable platelets and AST/ALT decreasing to 60s from 70s.   IUPC placed approx 2 hrs ago for better interpretation of ctx and pitocin titration.   FHT overall reassurring and reactive but one recent late decel. SVE: deferred currently Cont pitocin augmentation, approaching 24 hrs ruptured.  Will monitor closely for signs of chorioamnionitis.

## 2012-03-30 NOTE — Anesthesia Procedure Notes (Signed)
Epidural Patient location during procedure: OB Start time: 03/30/2012 11:44 AM  Staffing Anesthesiologist: Brayton Caves R Performed by: anesthesiologist   Preanesthetic Checklist Completed: patient identified, site marked, surgical consent, pre-op evaluation, timeout performed, IV checked, risks and benefits discussed and monitors and equipment checked  Epidural Patient position: sitting Prep: site prepped and draped and DuraPrep Patient monitoring: continuous pulse ox and blood pressure Approach: midline Injection technique: LOR air and LOR saline  Needle:  Needle type: Tuohy  Needle gauge: 17 G Needle length: 9 cm and 9 Needle insertion depth: 8 cm Catheter type: closed end flexible Catheter size: 19 Gauge Catheter at skin depth: 14 cm Test dose: negative  Assessment Events: blood not aspirated, injection not painful, no injection resistance, negative IV test and no paresthesia  Additional Notes Patient identified.  Risk benefits discussed including failed block, incomplete pain control, headache, nerve damage, paralysis, blood pressure changes, nausea, vomiting, reactions to medication both toxic or allergic, and postpartum back pain.  Patient expressed understanding and wished to proceed.  All questions were answered.  Sterile technique used throughout procedure and epidural site dressed with sterile barrier dressing. No paresthesia or other complications noted.The patient did not experience any signs of intravascular injection such as tinnitus or metallic taste in mouth nor signs of intrathecal spread such as rapid motor block. Please see nursing notes for vital signs.

## 2012-03-30 NOTE — Anesthesia Preprocedure Evaluation (Addendum)
Anesthesia Evaluation  Patient identified by MRN, date of birth, ID band Patient awake    Reviewed: Allergy & Precautions, H&P , Patient's Chart, lab work & pertinent test results  Airway Mallampati: III TM Distance: >3 FB Neck ROM: full    Dental No notable dental hx.    Pulmonary neg pulmonary ROS, asthma ,  breath sounds clear to auscultation  Pulmonary exam normal       Cardiovascular hypertension, negative cardio ROS  Rhythm:regular Rate:Normal     Neuro/Psych negative neurological ROS  negative psych ROS   GI/Hepatic negative GI ROS, Neg liver ROS,   Endo/Other  negative endocrine ROSMorbid obesity  Renal/GU negative Renal ROS     Musculoskeletal   Abdominal   Peds  Hematology negative hematology ROS (+)   Anesthesia Other Findings   Reproductive/Obstetrics (+) Pregnancy                          Anesthesia Physical Anesthesia Plan  ASA: III and emergent  Anesthesia Plan: Epidural   Post-op Pain Management:    Induction:   Airway Management Planned:   Additional Equipment:   Intra-op Plan:   Post-operative Plan:   Informed Consent: I have reviewed the patients History and Physical, chart, labs and discussed the procedure including the risks, benefits and alternatives for the proposed anesthesia with the patient or authorized representative who has indicated his/her understanding and acceptance.   Dental advisory given  Plan Discussed with: CRNA, Anesthesiologist and Surgeon  Anesthesia Plan Comments: (Patient for C/Section for failure to progress.)       Anesthesia Quick Evaluation

## 2012-03-31 ENCOUNTER — Inpatient Hospital Stay (HOSPITAL_COMMUNITY): Payer: Medicaid Other | Admitting: Anesthesiology

## 2012-03-31 ENCOUNTER — Encounter (HOSPITAL_COMMUNITY)
Admission: AD | Disposition: A | Discharge status: Home / Self Care | Payer: Self-pay | Source: Ambulatory Visit | Attending: Obstetrics & Gynecology

## 2012-03-31 ENCOUNTER — Encounter (HOSPITAL_COMMUNITY): Payer: Self-pay | Admitting: Anesthesiology

## 2012-03-31 ENCOUNTER — Encounter (HOSPITAL_COMMUNITY): Payer: Self-pay | Admitting: Family Medicine

## 2012-03-31 LAB — COMPREHENSIVE METABOLIC PANEL
Alkaline Phosphatase: 324 U/L — ABNORMAL HIGH (ref 39–117)
BUN: 9 mg/dL (ref 6–23)
CO2: 21 mEq/L (ref 19–32)
Chloride: 105 mEq/L (ref 96–112)
Creatinine, Ser: 0.86 mg/dL (ref 0.50–1.10)
GFR calc non Af Amer: >90 mL/min (ref >90)
Potassium: 3.8 mEq/L (ref 3.5–5.1)
Total Bilirubin: 0.3 mg/dL (ref 0.3–1.2)

## 2012-03-31 LAB — CBC
Hemoglobin: 9.2 g/dL — ABNORMAL LOW (ref 12.0–15.0)
Platelets: 110 10*3/uL — ABNORMAL LOW (ref 150–400)
RBC: 3.13 MIL/uL — ABNORMAL LOW (ref 3.87–5.11)
WBC: 10.6 10*3/uL — ABNORMAL HIGH (ref 4.0–10.5)

## 2012-03-31 SURGERY — Surgical Case
Anesthesia: Epidural

## 2012-03-31 MED ORDER — NALBUPHINE SYRINGE 5 MG/0.5 ML
5.0000 mg | INJECTION | INTRAMUSCULAR | Status: DC | PRN
  Filled 2012-03-31: qty 1

## 2012-03-31 MED ORDER — FENTANYL CITRATE 0.05 MG/ML IJ SOLN
INTRAMUSCULAR | Status: AC
  Filled 2012-03-31: qty 2

## 2012-03-31 MED ORDER — SODIUM BICARBONATE 8.4 % IV SOLN
INTRAVENOUS | Status: AC
  Filled 2012-03-31: qty 50

## 2012-03-31 MED ORDER — SIMETHICONE 80 MG PO CHEW
80.0000 mg | CHEWABLE_TABLET | Freq: Three times a day (TID) | ORAL | Status: DC
  Administered 2012-03-31 – 2012-04-03 (×7): 80 mg via ORAL

## 2012-03-31 MED ORDER — MEPERIDINE HCL 25 MG/ML IJ SOLN: 6.2500 mg | INTRAMUSCULAR | Status: DC | PRN

## 2012-03-31 MED ORDER — LACTATED RINGERS IV SOLN
INTRAVENOUS | Status: DC | PRN
  Administered 2012-03-31 (×2): via INTRAVENOUS

## 2012-03-31 MED ORDER — SODIUM CHLORIDE 0.9 % IJ SOLN: 3.0000 mL | INTRAMUSCULAR | Status: DC | PRN

## 2012-03-31 MED ORDER — DIPHENHYDRAMINE HCL 25 MG PO CAPS: 25.0000 mg | ORAL_CAPSULE | Freq: Two times a day (BID) | ORAL | Status: DC | PRN

## 2012-03-31 MED ORDER — ZOLPIDEM TARTRATE 5 MG PO TABS: 5.0000 mg | ORAL_TABLET | Freq: Every evening | ORAL | Status: DC | PRN

## 2012-03-31 MED ORDER — KETOROLAC TROMETHAMINE 30 MG/ML IJ SOLN: 30.0000 mg | Freq: Four times a day (QID) | INTRAMUSCULAR | Status: DC | PRN

## 2012-03-31 MED ORDER — SCOPOLAMINE 1 MG/3DAYS TD PT72
1.0000 | MEDICATED_PATCH | Freq: Once | TRANSDERMAL | Status: AC
  Administered 2012-03-31: 1.5 mg via TRANSDERMAL

## 2012-03-31 MED ORDER — SENNOSIDES-DOCUSATE SODIUM 8.6-50 MG PO TABS
2.0000 | ORAL_TABLET | Freq: Every day | ORAL | Status: DC
  Administered 2012-03-31 – 2012-04-01 (×2): 2 via ORAL

## 2012-03-31 MED ORDER — DIPHENHYDRAMINE HCL 50 MG/ML IJ SOLN: 25.0000 mg | INTRAMUSCULAR | Status: DC | PRN

## 2012-03-31 MED ORDER — PRENATAL MULTIVITAMIN CH
1.0000 | ORAL_TABLET | Freq: Every day | ORAL | Status: DC
  Filled 2012-03-31 (×2): qty 1

## 2012-03-31 MED ORDER — SODIUM BICARBONATE 8.4 % IV SOLN
INTRAVENOUS | Status: DC | PRN
  Administered 2012-03-31: 5 mL via EPIDURAL

## 2012-03-31 MED ORDER — LANOLIN HYDROUS EX OINT: 1.0000 "application " | TOPICAL_OINTMENT | CUTANEOUS | Status: DC | PRN

## 2012-03-31 MED ORDER — PRENATAL MULTIVITAMIN CH: 1.0000 | ORAL_TABLET | Freq: Every day | ORAL | Status: DC

## 2012-03-31 MED ORDER — DIPHENHYDRAMINE HCL 25 MG PO CAPS
25.0000 mg | ORAL_CAPSULE | ORAL | Status: DC | PRN
  Administered 2012-04-01: 25 mg via ORAL
  Filled 2012-03-31: qty 1

## 2012-03-31 MED ORDER — DIBUCAINE 1 % RE OINT: 1.0000 "application " | TOPICAL_OINTMENT | RECTAL | Status: DC | PRN

## 2012-03-31 MED ORDER — OXYTOCIN 40 UNITS IN LACTATED RINGERS INFUSION - SIMPLE MED: 62.5000 mL/h | INTRAVENOUS | Status: AC

## 2012-03-31 MED ORDER — MORPHINE SULFATE (PF) 0.5 MG/ML IJ SOLN
INTRAMUSCULAR | Status: DC | PRN
  Administered 2012-03-31: 4 mg via EPIDURAL
  Administered 2012-03-31: 1 mg via INTRAVENOUS

## 2012-03-31 MED ORDER — CLINDAMYCIN PHOSPHATE 900 MG/50ML IV SOLN: 900.0000 mg | Freq: Once | INTRAVENOUS | Status: DC

## 2012-03-31 MED ORDER — LACTATED RINGERS IV SOLN
INTRAVENOUS | Status: DC
Start: 2012-03-31 — End: 2012-04-03
  Administered 2012-03-31: 15:00:00 via INTRAVENOUS

## 2012-03-31 MED ORDER — METOCLOPRAMIDE HCL 5 MG/ML IJ SOLN: 10.0000 mg | Freq: Three times a day (TID) | INTRAMUSCULAR | Status: DC | PRN

## 2012-03-31 MED ORDER — GENTAMICIN SULFATE 40 MG/ML IJ SOLN
Freq: Once | INTRAVENOUS | Status: AC
  Administered 2012-03-31: 220 mL via INTRAVENOUS
  Filled 2012-03-31: qty 5.5

## 2012-03-31 MED ORDER — SCOPOLAMINE 1 MG/3DAYS TD PT72
MEDICATED_PATCH | TRANSDERMAL | Status: AC
  Filled 2012-03-31: qty 1

## 2012-03-31 MED ORDER — DIPHENHYDRAMINE HCL 25 MG PO CAPS: 25.0000 mg | ORAL_CAPSULE | Freq: Four times a day (QID) | ORAL | Status: DC | PRN

## 2012-03-31 MED ORDER — NALOXONE HCL 1 MG/ML IJ SOLN
1.0000 ug/kg/h | INTRAVENOUS | Status: DC | PRN
  Filled 2012-03-31: qty 2

## 2012-03-31 MED ORDER — TETANUS-DIPHTH-ACELL PERTUSSIS 5-2.5-18.5 LF-MCG/0.5 IM SUSP
0.5000 mL | Freq: Once | INTRAMUSCULAR | Status: DC
Start: 2012-04-01 — End: 2012-04-03

## 2012-03-31 MED ORDER — ONDANSETRON HCL 4 MG/2ML IJ SOLN
INTRAMUSCULAR | Status: AC
  Filled 2012-03-31: qty 2

## 2012-03-31 MED ORDER — FENTANYL CITRATE 0.05 MG/ML IJ SOLN
25.0000 ug | INTRAMUSCULAR | Status: DC | PRN
  Administered 2012-03-31: 25 ug via INTRAVENOUS

## 2012-03-31 MED ORDER — LACTATED RINGERS IV SOLN
INTRAVENOUS | Status: DC | PRN
  Administered 2012-03-31: 04:00:00 via INTRAVENOUS

## 2012-03-31 MED ORDER — PHENYLEPHRINE-DM-GG-APAP 5-10-200-325 MG PO TABS: 1.0000 | ORAL_TABLET | Freq: Every day | ORAL | Status: DC | PRN

## 2012-03-31 MED ORDER — PSEUDOEPHEDRINE HCL 30 MG PO TABS: 30.0000 mg | ORAL_TABLET | ORAL | Status: DC | PRN

## 2012-03-31 MED ORDER — DIPHENHYDRAMINE HCL 50 MG/ML IJ SOLN: 12.5000 mg | INTRAMUSCULAR | Status: DC | PRN

## 2012-03-31 MED ORDER — LIDOCAINE-EPINEPHRINE (PF) 2 %-1:200000 IJ SOLN
INTRAMUSCULAR | Status: AC
  Filled 2012-03-31: qty 20

## 2012-03-31 MED ORDER — IBUPROFEN 600 MG PO TABS: 600.0000 mg | ORAL_TABLET | Freq: Four times a day (QID) | ORAL | Status: DC | PRN

## 2012-03-31 MED ORDER — ONDANSETRON HCL 4 MG/2ML IJ SOLN
INTRAMUSCULAR | Status: DC | PRN
  Administered 2012-03-31: 4 mg via INTRAVENOUS

## 2012-03-31 MED ORDER — MENTHOL 3 MG MT LOZG: 1.0000 | LOZENGE | OROMUCOSAL | Status: DC | PRN

## 2012-03-31 MED ORDER — OXYCODONE-ACETAMINOPHEN 5-325 MG PO TABS
1.0000 | ORAL_TABLET | ORAL | Status: DC | PRN
  Administered 2012-03-31: 1 via ORAL
  Administered 2012-04-01 (×2): 2 via ORAL
  Administered 2012-04-01: 1 via ORAL
  Administered 2012-04-01: 2 via ORAL
  Administered 2012-04-01: 1 via ORAL
  Administered 2012-04-01: 2 via ORAL
  Administered 2012-04-02 (×2): 1 via ORAL
  Administered 2012-04-02 – 2012-04-03 (×2): 2 via ORAL
  Administered 2012-04-03 (×2): 1 via ORAL
  Filled 2012-03-31 (×3): qty 2
  Filled 2012-03-31 (×4): qty 1
  Filled 2012-03-31 (×2): qty 2
  Filled 2012-03-31 (×3): qty 1
  Filled 2012-03-31: qty 2

## 2012-03-31 MED ORDER — ONDANSETRON HCL 4 MG PO TABS: 4.0000 mg | ORAL_TABLET | ORAL | Status: DC | PRN

## 2012-03-31 MED ORDER — ONDANSETRON HCL 4 MG/2ML IJ SOLN: 4.0000 mg | Freq: Three times a day (TID) | INTRAMUSCULAR | Status: DC | PRN

## 2012-03-31 MED ORDER — OXYTOCIN 10 UNIT/ML IJ SOLN
40.0000 [IU] | INTRAVENOUS | Status: DC | PRN
  Administered 2012-03-31: 40 [IU] via INTRAVENOUS

## 2012-03-31 MED ORDER — OXYTOCIN 10 UNIT/ML IJ SOLN
INTRAMUSCULAR | Status: AC
  Filled 2012-03-31: qty 4

## 2012-03-31 MED ORDER — ONDANSETRON HCL 4 MG/2ML IJ SOLN: 4.0000 mg | INTRAMUSCULAR | Status: DC | PRN

## 2012-03-31 MED ORDER — SIMETHICONE 80 MG PO CHEW: 80.0000 mg | CHEWABLE_TABLET | ORAL | Status: DC | PRN

## 2012-03-31 MED ORDER — WITCH HAZEL-GLYCERIN EX PADS: 1.0000 "application " | MEDICATED_PAD | CUTANEOUS | Status: DC | PRN

## 2012-03-31 MED ORDER — MORPHINE SULFATE 0.5 MG/ML IJ SOLN
INTRAMUSCULAR | Status: AC
  Filled 2012-03-31: qty 10

## 2012-03-31 MED ORDER — ALBUTEROL SULFATE HFA 108 (90 BASE) MCG/ACT IN AERS
2.0000 | INHALATION_SPRAY | RESPIRATORY_TRACT | Status: DC | PRN
  Filled 2012-03-31: qty 6.7

## 2012-03-31 MED ORDER — FENTANYL CITRATE 0.05 MG/ML IJ SOLN
INTRAMUSCULAR | Status: DC | PRN
  Administered 2012-03-31: 100 ug via INTRAVENOUS

## 2012-03-31 MED ORDER — IBUPROFEN 600 MG PO TABS
600.0000 mg | ORAL_TABLET | Freq: Four times a day (QID) | ORAL | Status: DC
  Administered 2012-03-31 – 2012-04-03 (×12): 600 mg via ORAL
  Filled 2012-03-31 (×13): qty 1

## 2012-03-31 MED ORDER — NALOXONE HCL 0.4 MG/ML IJ SOLN: 0.4000 mg | INTRAMUSCULAR | Status: DC | PRN

## 2012-03-31 MED ORDER — SODIUM CHLORIDE 0.9 % IV SOLN
2.0000 g | Freq: Four times a day (QID) | INTRAVENOUS | Status: DC
  Administered 2012-03-31: 2 g via INTRAVENOUS
  Filled 2012-03-31 (×3): qty 2000

## 2012-03-31 SURGICAL SUPPLY — 37 items
CLOTH BEACON ORANGE TIMEOUT ST (SAFETY) ×2 IMPLANT
DRAPE LG THREE QUARTER DISP (DRAPES) ×2 IMPLANT
DRESSING TELFA 8X3 (GAUZE/BANDAGES/DRESSINGS) ×2 IMPLANT
DRSG COVADERM 4X10 (GAUZE/BANDAGES/DRESSINGS) ×2 IMPLANT
DRSG OPSITE POSTOP 4X10 (GAUZE/BANDAGES/DRESSINGS) ×2 IMPLANT
DRSG OPSITE POSTOP 4X12 (GAUZE/BANDAGES/DRESSINGS) ×2 IMPLANT
DURAPREP 26ML APPLICATOR (WOUND CARE) ×2 IMPLANT
ELECT REM PT RETURN 9FT ADLT (ELECTROSURGICAL) ×2
ELECTRODE REM PT RTRN 9FT ADLT (ELECTROSURGICAL) ×1 IMPLANT
EXTRACTOR VACUUM M CUP 4 TUBE (SUCTIONS) IMPLANT
GAUZE SPONGE 4X4 12PLY STRL LF (GAUZE/BANDAGES/DRESSINGS) ×4 IMPLANT
GLOVE BIO SURGEON STRL SZ 6.5 (GLOVE) ×2 IMPLANT
GLOVE BIOGEL PI IND STRL 6.5 (GLOVE) ×2 IMPLANT
GLOVE BIOGEL PI INDICATOR 6.5 (GLOVE) ×2
GLOVE INDICATOR 7.5 STRL GRN (GLOVE) ×2 IMPLANT
GLOVE SURG SS PI 7.5 STRL IVOR (GLOVE) ×2 IMPLANT
GOWN PREVENTION PLUS LG XLONG (DISPOSABLE) ×4 IMPLANT
KIT ABG SYR 3ML LUER SLIP (SYRINGE) IMPLANT
NEEDLE HYPO 25X5/8 SAFETYGLIDE (NEEDLE) IMPLANT
NS IRRIG 1000ML POUR BTL (IV SOLUTION) ×2 IMPLANT
PACK C SECTION WH (CUSTOM PROCEDURE TRAY) ×2 IMPLANT
PAD ABD 7.5X8 STRL (GAUZE/BANDAGES/DRESSINGS) IMPLANT
PAD OB MATERNITY 4.3X12.25 (PERSONAL CARE ITEMS) ×2 IMPLANT
RTRCTR C-SECT PINK 25CM LRG (MISCELLANEOUS) ×2 IMPLANT
SLEEVE SCD COMPRESS KNEE MED (MISCELLANEOUS) IMPLANT
STAPLER VISISTAT 35W (STAPLE) ×2 IMPLANT
SUT MON AB 2-0 CT1 27 (SUTURE) ×2 IMPLANT
SUT MON AB 4-0 PS1 27 (SUTURE) IMPLANT
SUT PLAIN 2 0 (SUTURE) ×1
SUT PLAIN 2 0 XLH (SUTURE) IMPLANT
SUT PLAIN ABS 2-0 CT1 27XMFL (SUTURE) ×1 IMPLANT
SUT VIC AB 0 CTX 36 (SUTURE) ×4
SUT VIC AB 0 CTX36XBRD ANBCTRL (SUTURE) ×4 IMPLANT
SUT VIC AB 4-0 KS 27 (SUTURE) IMPLANT
TOWEL OR 17X24 6PK STRL BLUE (TOWEL DISPOSABLE) ×6 IMPLANT
TRAY FOLEY CATH 14FR (SET/KITS/TRAYS/PACK) ×2 IMPLANT
WATER STERILE IRR 1000ML POUR (IV SOLUTION) ×2 IMPLANT

## 2012-03-31 NOTE — Progress Notes (Signed)
ANTIBIOTIC CONSULT NOTE - INITIAL  Pharmacy Consult for  gentamicin Indication:  Maternal fever  No Known Allergies  Patient Measurements: Height: 5\' 4"  (162.6 cm) Weight: 252 lb (114.306 kg) IBW/kg (Calculated) : 54.7  Adjusted Body Weight: 74kg  Vital Signs: Temp: 100.8 F (38.2 C) (12/11 0202) Temp src: Oral (12/11 0202) BP: 151/80 mmHg (12/11 0202) Pulse Rate: 118  (12/11 0202) Intake/Output from previous day: 12/10 0701 - 12/11 0700 In: -  Out: 1850 [Urine:1850] Intake/Output from this shift: Total I/O In: -  Out: 1850 [Urine:1850]  Labs:  [facility] 03/30/12 1436 03/30/12 1043 03/29/12 2200  WBC 9.9 11.9* 10.9*  HGB 10.5* 10.4* 10.2*  PLT 111* 125* 118*  LABCREA -- -- --  CREATININE 0.60 -- 0.61   Estimated Creatinine Clearance: 136.7 ml/min (by C-G formula based on Cr of 0.6).    Microbiology: Recent Results (from the past 720 hour(s))  OB RESULTS CONSOLE GBS     Status: Normal      Component Value Range Status Comment   GBS Positive        Medical History: Past Medical History  Diagnosis Date  . Seasonal allergies   . Asthma     Medications:  Scheduled:    . ampicillin (OMNIPEN) IV  2 g Intravenous Q6H  . gentamicin (GARAMYCIN) with clindamycin (CLEOCIN) IV   Intravenous Once  . lactated ringers  500 mL Intravenous Once  . misoprostol  25 mcg Vaginal Q4H  . [DISCONTINUED] clindamycin (CLEOCIN) IV  900 mg Intravenous Once  . [DISCONTINUED] pencillin G potassium IV  2.5 Million Units Intravenous Q4H   Infusions:    . fentaNYL 2.5 mcg/ml w/bupivacaine 1/10% in NS epidural infusion ( total) 14 mL/hr (03/31/12 0104)  . lactated ringers 125 mL/hr at 03/30/12 2339  . oxytocin 40 units in LR 1000 mL    . oxytocin 40 units in LR 1000 mL    . oxytocin 40 units in LR 1000 mL Stopped (03/31/12 0048)  . [DISCONTINUED] oxytocin 40 units in LR 1000 mL     PRN: acetaminophen, butorphanol, citric acid-sodium citrate, diphenhydrAMINE, ePHEDrine,  ePHEDrine, ibuprofen, lactated ringers, lidocaine, ondansetron, oxyCODONE-acetaminophen, phenylephrine, phenylephrine, sodium chloride, sodium phosphate Assessment: Coverage for maternal fever - likely source is of pelvic origin ; R/O chorioamnionitis  Goal of Therapy:  Desire peak gantamicin level 7-78mcg/ml and trough <25mcg/ml.  Plan:  1.  Loading Dose Gemntamicin 220mg  (with Clindamycin 900mg ) x 1 dose, then 2.  Gentamicin 180mg  IV q8h (with clindamycin) if continued post C-section 3.  Will measure actual gentamicn se  Patty Lopezgarcia, Shon Baton 03/31/2012,2:19 AM

## 2012-03-31 NOTE — Progress Notes (Signed)
Subjective: Postpartum Day 0: Cesarean Delivery Patient reports good pain control. Denies nausea or vomiting.  Catheter still in place. Baby not wanting to latch to breastfeed  Objective: Vital signs in last 24 hours: Temp:  [97.5 F (36.4 C)-100.8 F (38.2 C)] 97.5 F (36.4 C) (12/11 0745) Pulse Rate:  [25-126] 91  (12/11 0745) Resp:  [14-24] 20  (12/11 0745) BP: (121-171)/(47-104) 127/76 mmHg (12/11 0745) SpO2:  [89 %-100 %] 95 % (12/11 0745)  Physical Exam:  General: alert, cooperative and appears stated age Lochia: appropriate Uterine Fundus: firm Incision: dressing clean and intact DVT Evaluation: No evidence of DVT seen on physical exam. SCDs in place   [facility] 03/31/12 0525 03/30/12 1436  HGB 9.2* 10.5*  HCT 27.7* 31.8*    Assessment/Plan: Status post Cesarean section. Doing well postoperatively.  Continue current care. Lactation consultation  Dawn Kushnir H. 03/31/2012, 9:48 AM

## 2012-03-31 NOTE — Anesthesia Postprocedure Evaluation (Signed)
Anesthesia Post Note  Patient: [name]  Procedure(s) Performed: Procedure(s) (LRB): CESAREAN SECTION (N/A)  Anesthesia type: Epidural  Patient location: Mother/Baby  Post pain: Pain level controlled  Post assessment: Post-op Vital signs reviewed  Last Vitals:  Filed Vitals:   03/31/12 0745  BP: 127/76  Pulse: 91  Temp: 36.4 C  Resp: 20    Post vital signs: Reviewed  Level of consciousness: awake  Complications: No apparent anesthesia complications

## 2012-03-31 NOTE — Anesthesia Postprocedure Evaluation (Signed)
  Anesthesia Post-op Note  Patient: [name]  Procedure(s) Performed: Procedure(s) (LRB) with comments: CESAREAN SECTION (N/A)  Patient Location: PACU  Anesthesia Type:Epidural  Level of Consciousness: awake, alert  and oriented  Airway and Oxygen Therapy: Patient Spontanous Breathing  Post-op Pain: none  Post-op Assessment: Post-op Vital signs reviewed, Patient's Cardiovascular Status Stable, Respiratory Function Stable, Patent Airway, No signs of Nausea or vomiting, Pain level controlled, No headache, No backache, No residual numbness and No residual motor weakness  Post-op Vital Signs: Reviewed and stable  Complications: No apparent anesthesia complications

## 2012-03-31 NOTE — Op Note (Signed)
Preop dx: FTP, 37.4, prolonged rupture of membranes, suspected chorioamnionitis Postop dx: same Procedure: LTCS Surgeon: Delray Alt Anesthesia: epidural Abx: amp, gent, clinca Fluids: 2300cc EBL: 700cc UOP: 200cc Findings: female infant, APGARS 9/10 cephalic, nl tubes and ovaries Specimen: cord blood, placenta to pathology Complications: none Condition: stable to PACU

## 2012-03-31 NOTE — Transfer of Care (Signed)
Immediate Anesthesia Transfer of Care Note  Patient: [name]  Procedure(s) Performed: Procedure(s) (LRB) with comments: CESAREAN SECTION (N/A)  Patient Location: PACU  Anesthesia Type:Epidural  Level of Consciousness: awake, alert , oriented and patient cooperative  Airway & Oxygen Therapy: Patient Spontanous Breathing  Post-op Assessment: Report given to PACU RN and Post -op Vital signs reviewed and stable  Post vital signs: Reviewed and stable  Complications: No apparent anesthesia complications

## 2012-03-31 NOTE — Addendum Note (Signed)
Addendum  created 03/31/12 1610 by Jhonnie Garner, CRNA   Modules edited:Notes Section

## 2012-04-01 ENCOUNTER — Encounter (HOSPITAL_COMMUNITY): Payer: Self-pay | Admitting: Obstetrics and Gynecology

## 2012-04-01 LAB — CBC
MCH: 30.4 pg (ref 26.0–34.0)
Platelets: 122 10*3/uL — ABNORMAL LOW (ref 150–400)
RBC: 3.03 MIL/uL — ABNORMAL LOW (ref 3.87–5.11)
RDW: 14.2 % (ref 11.5–15.5)
WBC: 8.9 10*3/uL (ref 4.0–10.5)

## 2012-04-01 MED ORDER — COMPLETENATE 29-1 MG PO CHEW
1.0000 | CHEWABLE_TABLET | Freq: Every day | ORAL | Status: DC
  Administered 2012-04-01 – 2012-04-03 (×2): 1 via ORAL
  Filled 2012-04-01 (×4): qty 1

## 2012-04-01 NOTE — Consult Note (Signed)
[name], [name]            ACCOUNT NO.:  [id number]  MEDICAL RECORD NO.:  [id number]  LOCATION:  9127                          FACILITY:  [facility]  PHYSICIAN:  Philip Aspen, DO    DATE OF BIRTH:  12-12-1990  DATE OF CONSULTATION:  03/30/2012 DATE OF DISCHARGE:                                CONSULTATION   PREOPERATIVE DIAGNOSES: 1. Failure to progress. 2. Prolonged rupture of membranes. 3. 37+ weeks' gestation. 4. Suspected chorioamnionitis.  POSTOPERATIVE DIAGNOSES: 1. Failure to progress. 2. Prolonged rupture of membranes. 3. 37+ weeks' gestation. 4. Suspected chorioamnionitis.  PROCEDURE:  Low-transverse cesarean section.  SURGEON:  Philip Aspen, DO  ANESTHESIA:  Epidural.  ANTIBIOTICS:  Ampicillin, gentamicin, and clindamycin preoperatively.  FLUIDS:  2300 mL.  ESTIMATED BLOOD LOSS:  700 mL.  URINE OUTPUT:  200 mL.  FINDINGS:  Female infant, cephalic presentation, with Apgars of 9 and 10. Weight pending.  Normal tubes and ovaries bilaterally.  SPECIMENS:  Cord blood and placenta to Pathology.  COMPLICATIONS:  None.  CONDITION:  Stable to PACU.  DESCRIPTION OF PROCEDURE:  The patient was taken to the operating room after review of risks, benefits, expectations, and alternatives to C- section were discussed with patient and consent was confirmed.  The patient was prepped and draped in the normal sterile fashion in dorsal supine position, and a Foley catheter was placed.  After confirming adequate anesthesia, a scalpel was used to make a Pfannenstiel skin incision, which was carried down to the underlying layer of fascia with the Bovie cautery.  Multiple bleeding points were controlled with use of Bovie.  The fascia was then incised with a scalpel and extended laterally with Mayo scissors.  Kocher clamps were placed in the superior aspect of the fascial incision, tented and rectus muscles dissected off bluntly and sharply.  Kocher clamps were then  placed at the inferior aspect of the fascial incision and again rectus muscles were dissected off bluntly and sharply.  Hemostat was used to separate the rectus muscles in the midline and tented the peritoneum, which was incised with Metzenbaum scissors.  The peritoneal incision was extended with lateral traction bilaterally.  The Alexis self retractor was placed after the extent of the uterine position in the abdomen was noted.  The baby's head was easily palpated and a low-transverse cesarean incision was made.  The amniotic sac protruded from this incision and was entered with Allis clamps.  The incision was extended by cephalic and caudal traction.  The infant's head was elevated easily from the pelvis and delivered without difficulty with the remainder of the infant's body. The cord was clamped and cut.  The baby was bulb suctioned and handed off to awaiting neonatology.  Cord blood was collected from the umbilical cord and external massage of the uterus was performed with gentle traction on the umbilical cord until the placenta was delivered. The cavity of the uterus was cleared of all clots and debris, and the uterine incision was closed with 0-Vicryl in a running, locked fashion. Second layer of horizontal Lambert imbrication was performed again with 0-Vicryl.  Excellent hemostasis was noted.  Ovaries and tubes were examined and found to be normal.  The [employer]  self retractor was removed and any additional minimal clot noted was removed.  The peritoneum was then closed with Monocryl with good visualization of the bladder.  The lower part of the rectus muscles were reapproximated and gently brought together with the remaining Monocryl suture.  The fascia was then reapproximated and closed with Vicryl in a running fashion.  The subcutaneous tissue was irrigated and dried and found to be hemostatic. Additional plain gut suture was used to place 5 interrupted subcutaneous sutures to  close the dead space.  The skin was then reapproximated and closed with staples.  Sponge, lap, and needle counts were correct x2. The patient tolerated the procedure well.  The patient was taken to recovery in stable condition.          ______________________________ Philip Aspen, DO     [place]/MEDQ  D:  04/01/2012  T:  04/01/2012  Job:  [id number]

## 2012-04-01 NOTE — Progress Notes (Signed)
  Patient is eating, ambulating, voiding.  Pain control is good.  Filed Vitals:   03/31/12 1332 03/31/12 1500 03/31/12 1900 03/31/12 2316  BP: 135/92 127/84 145/84 135/86  Pulse: 102 92 96 92  Temp:  98.2 F (36.8 C) 98.3 F (36.8 C) 97.3 F (36.3 C)  TempSrc:  Oral Oral   Resp:  20 20 18   Height:      Weight:      SpO2:  98%  98%    lungs:   clear to auscultation cor:    RRR Abdomen:  soft, appropriate tenderness, incisions intact and without erythema or exudate ex:    no cords   Lab Results  Component Value Date   WBC 8.9 04/01/2012   HGB 9.2* 04/01/2012   HCT 26.8* 04/01/2012   MCV 88.4 04/01/2012   PLT 122* 04/01/2012    --/--/O POS (12/09 1910)/RI  A/P    Post operative day 1.  Routine post op and postpartum care.  Expect d/c tomorrow.  Percocet for pain control.  Circ in office.

## 2012-04-02 LAB — TYPE AND SCREEN
ABO/RH(D): O POS
Unit division: 0
Unit division: 0

## 2012-04-02 NOTE — Progress Notes (Signed)
Post Op Day 2 Subjective: no complaints  Objective: Blood pressure 138/87, pulse 89, temperature 98 F (36.7 C), temperature source Axillary, resp. rate 20, height 5\' 4"  (1.626 m), weight 252 lb (114.306 kg), last menstrual period 07/12/2011, SpO2 98.00%,  breastfeeding.  Physical Exam:  General: alert Lochia: appropriate Uterine Fundus: firm Incision: healing well   Basename 04/01/12 0510 03/31/12 0525  HGB 9.2* 9.2*  HCT 26.8* 27.7*    Assessment/Plan: Plan for discharge tomorrow (baby not ready for early discharge).   LOS: 4 days   [name] D 04/02/2012, 10:55 AM

## 2012-04-03 MED ORDER — OXYCODONE-ACETAMINOPHEN 5-325 MG PO TABS: 1.0000 | ORAL_TABLET | ORAL | Status: DC | PRN

## 2012-04-03 NOTE — Discharge Summary (Signed)
Obstetric Discharge Summary Reason for Admission: rupture of membranes Prenatal Procedures: ultrasound Intrapartum Procedures: cesarean: low cervical, transverse Postpartum Procedures: none Complications-Operative and Postpartum: none Hemoglobin  Date Value Range Status  04/01/2012 9.2* 12.0 - 15.0 g/dL Final     HCT  Date Value Range Status  04/01/2012 26.8* 36.0 - 46.0 % Final    Physical Exam:  General: alert Lochia: appropriate Uterine Fundus: firm Incision: healing well DVT Evaluation: No evidence of DVT seen on physical exam.  Discharge Diagnoses: Term Pregnancy-delivered, Amnionitis, Failed induction and PROM x30 hours  Discharge Information: Date: 04/03/2012 Activity: Limited Diet: routine Medications: PNV and Percocet Condition: stable Instructions: refer to practice specific booklet Discharge to: home Follow-up Information    Follow up with CALLAHAN, SIDNEY, DO. Schedule an appointment as soon as possible for a visit in 4 weeks.   Contact information:   [address] [place] [address] [phone]          Newborn Data: Live born female  Birth Weight: 6 lb 8.4 oz (2960 g) APGAR: 8, 10  Home with mother.  [name] D 04/03/2012, 11:00 AM

## 2012-04-07 ENCOUNTER — Ambulatory Visit (HOSPITAL_COMMUNITY): Payer: PRIVATE HEALTH INSURANCE

## 2012-04-15 ENCOUNTER — Ambulatory Visit (HOSPITAL_COMMUNITY): Admission: RE | Admit: 2012-04-15 | Payer: PRIVATE HEALTH INSURANCE | Source: Ambulatory Visit

## 2013-06-09 ENCOUNTER — Other Ambulatory Visit: Payer: Self-pay | Admitting: Obstetrics and Gynecology

## 2013-06-09 ENCOUNTER — Other Ambulatory Visit (HOSPITAL_COMMUNITY)
Admission: RE | Admit: 2013-06-09 | Discharge: 2013-06-09 | Disposition: A | Discharge status: Home / Self Care | Payer: 59 | Source: Ambulatory Visit | Attending: Obstetrics and Gynecology | Admitting: Obstetrics and Gynecology

## 2013-06-09 DIAGNOSIS — Z01419 Encounter for gynecological examination (general) (routine) without abnormal findings: Secondary | ICD-10-CM | POA: Insufficient documentation

## 2013-06-09 DIAGNOSIS — Z113 Encounter for screening for infections with a predominantly sexual mode of transmission: Secondary | ICD-10-CM | POA: Insufficient documentation

## 2014-02-20 ENCOUNTER — Encounter (HOSPITAL_COMMUNITY): Payer: Self-pay | Admitting: Obstetrics and Gynecology

## 2014-08-01 ENCOUNTER — Encounter: Payer: Self-pay | Admitting: *Deleted

## 2015-06-28 ENCOUNTER — Other Ambulatory Visit: Payer: Self-pay | Admitting: Obstetrics and Gynecology

## 2015-06-28 ENCOUNTER — Other Ambulatory Visit (HOSPITAL_COMMUNITY)
Admission: RE | Admit: 2015-06-28 | Discharge: 2015-06-28 | Disposition: A | Discharge status: Home / Self Care | Payer: No Typology Code available for payment source | Source: Ambulatory Visit | Attending: Obstetrics and Gynecology | Admitting: Obstetrics and Gynecology

## 2015-06-28 DIAGNOSIS — Z01419 Encounter for gynecological examination (general) (routine) without abnormal findings: Secondary | ICD-10-CM | POA: Insufficient documentation

## 2015-06-29 LAB — CYTOLOGY - PAP

## 2015-10-31 ENCOUNTER — Encounter: Payer: Self-pay | Admitting: Family Medicine

## 2015-10-31 ENCOUNTER — Ambulatory Visit (INDEPENDENT_AMBULATORY_CARE_PROVIDER_SITE_OTHER): Payer: Managed Care, Other (non HMO) | Admitting: Family Medicine

## 2015-10-31 VITALS — BP 134/90 | HR 68 | Ht 65.0 in | Wt 245.4 lb

## 2015-10-31 DIAGNOSIS — Z0000 Encounter for general adult medical examination without abnormal findings: Secondary | ICD-10-CM | POA: Diagnosis not present

## 2015-10-31 DIAGNOSIS — Z6841 Body Mass Index (BMI) 40.0 and over, adult: Secondary | ICD-10-CM

## 2015-10-31 LAB — CBC WITH DIFFERENTIAL/PLATELET
Basophils Absolute: 0 cells/uL (ref 0–200)
Basophils Relative: 0 %
EOS PCT: 4 %
Eosinophils Absolute: 228 cells/uL (ref 15–500)
HCT: 38.5 % (ref 35.0–45.0)
Hemoglobin: 12.6 g/dL (ref 11.7–15.5)
LYMPHS ABS: 2223 {cells}/uL (ref 850–3900)
LYMPHS PCT: 39 %
MCH: 29.4 pg (ref 27.0–33.0)
MCHC: 32.7 g/dL (ref 32.0–36.0)
MCV: 89.7 fL (ref 80.0–100.0)
MONOS PCT: 8 %
MPV: 10.4 fL (ref 7.5–12.5)
Monocytes Absolute: 456 cells/uL (ref 200–950)
NEUTROS PCT: 49 %
Neutro Abs: 2793 cells/uL (ref 1500–7800)
PLATELETS: 229 10*3/uL (ref 140–400)
RBC: 4.29 MIL/uL (ref 3.80–5.10)
RDW: 13.2 % (ref 11.0–15.0)
WBC: 5.7 10*3/uL (ref 4.0–10.5)

## 2015-10-31 LAB — COMPREHENSIVE METABOLIC PANEL
ALK PHOS: 88 U/L (ref 33–115)
ALT: 13 U/L (ref 6–29)
AST: 15 U/L (ref 10–30)
Albumin: 4.4 g/dL (ref 3.6–5.1)
BILIRUBIN TOTAL: 0.4 mg/dL (ref 0.2–1.2)
BUN: 14 mg/dL (ref 7–25)
CO2: 24 mmol/L (ref 20–31)
CREATININE: 0.88 mg/dL (ref 0.50–1.10)
Calcium: 8.8 mg/dL (ref 8.6–10.2)
Chloride: 108 mmol/L (ref 98–110)
Glucose, Bld: 73 mg/dL (ref 65–99)
POTASSIUM: 4 mmol/L (ref 3.5–5.3)
Sodium: 141 mmol/L (ref 135–146)
TOTAL PROTEIN: 6.9 g/dL (ref 6.1–8.1)

## 2015-10-31 LAB — POCT URINALYSIS DIPSTICK
BILIRUBIN UA: NEGATIVE
Blood, UA: NEGATIVE
Glucose, UA: NEGATIVE
KETONES UA: NEGATIVE
Leukocytes, UA: NEGATIVE
Nitrite, UA: NEGATIVE
PH UA: 6
PROTEIN UA: NEGATIVE
Urobilinogen, UA: NEGATIVE

## 2015-10-31 LAB — TSH: TSH: 1.86 m[IU]/L

## 2015-10-31 NOTE — Patient Instructions (Signed)

## 2015-10-31 NOTE — Progress Notes (Signed)
Subjective:    Patient ID: [name], female    DOB: November 25, 1990, 25 y.o.   MRN: [id number]  HPI Chief Complaint  Patient presents with  . new pt    new pt cpe- no pap. no other concerns   She is new to the practice and here for a complete physical exam. Previous medical care: Gynecologist only for past 3 years.   Past medical history: childhood asthma. No issue in 10 years. Takes Zyrtec in spring for allergies.  Surgeries: c- section  Family history: Diabetes type 2, PGF. Father with HTN. Paternal aunt had Breast cancer in 2s.  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  Excerise: 2 days a week she runs on treadmill, or elliptical.   Immunizations: Tdap up to date. HPV done  Health maintenance:  Mammogram: never Colonoscopy: never Last Gynecological Exam: earlier this year Last Menstrual cycle: irregular, had IUD and just started on progesterone only pill.  Pregnancies: 1  Last Dental Exam: 1 year ago at [facility].  Last Eye Exam: 2 years ago. [facility].   Wears seatbelt always, uses sunscreen, smoke detectors in home and functioning, does not text while driving and feels safe in home environment.   Reviewed allergies, medications, past medical, surgical, family, and social history.    Review of Systems Review of Systems Constitutional: -fever, -chills, -sweats, -unexpected weight change,-fatigue ENT: -runny nose, -ear pain, -sore throat Cardiology:  -chest pain, +palpitations in April, -edema Respiratory: -cough, -shortness of breath, -wheezing Gastroenterology: -abdominal pain, -nausea, -vomiting, -diarrhea, -constipation  Hematology: -bleeding or bruising problems Musculoskeletal: -arthralgias, -myalgias, -joint swelling,  -back pain Ophthalmology: -vision changes Urology: -dysuria, -difficulty urinating, -hematuria, -urinary frequency, -urgency Neurology: -headache, -weakness, -tingling, -numbness       Objective:   Physical Exam BP 134/90 mmHg  Pulse 68  Ht  (1.651 m)  Wt 245 lb 6.4 oz (111.313 kg)  BMI 40.84 kg/m2  LMP 10/18/2015  General Appearance:    Alert, cooperative, no distress, appears stated age  Head:    Normocephalic, without obvious abnormality, atraumatic  Eyes:    PERRL, conjunctiva/corneas clear, EOM's intact, fundi    benign  Ears:    Normal TM's and external ear canals  Nose:   Nares normal, mucosa normal, no drainage or sinus   tenderness  Throat:   Lips, mucosa, and tongue normal; teeth and gums normal  Neck:   Supple, no lymphadenopathy;  thyroid:  no   enlargement/tenderness/nodules; no carotid   bruit or JVD  Back:    Spine nontender, no curvature, ROM normal, no CVA     tenderness  Lungs:     Clear to auscultation bilaterally without wheezes, rales or     ronchi; respirations unlabored  Chest Wall:    No tenderness or deformity   Heart:    Regular rate and rhythm, S1 and S2 normal, no murmur, rub   or gallop  Breast Exam:    Has gynecologist - done in 2017  Abdomen:     Soft, non-tender, nondistended, normoactive bowel sounds,    no masses, no hepatosplenomegaly  Genitalia:    Has gynecologist- done 2017  Rectal:    Not performed due to age<40 and no related complaints  Extremities:   No clubbing, cyanosis or edema  Pulses:   2+ and symmetric all extremities  Skin:   Skin color, texture, turgor normal, no rashes or lesions  Lymph nodes:   Cervical, supraclavicular, and axillary nodes normal  Neurologic:   CNII-XII intact, normal strength, sensation and gait; reflexes 2+ and symmetric throughout          Psych:   Normal mood, affect, hygiene and grooming.    Urinalysis dipstick: negative     Assessment & Plan:  Routine general medical examination at a health  care facility - Plan: Comprehensive metabolic panel, CBC with Differential/Platelet, TSH, POCT urinalysis dipstick  BMI 40.0-44.9, adult (HCC) - Plan: Comprehensive metabolic panel, CBC with Differential/Platelet, TSH  Discussed that she appears to be overall healthy. Recommend watching calorie intake and getting at least 150 minutes of physical activity per week. Discussed that her BMI places her in the severely obese category and that this increases her risk for developing diabetes, HTN, heart disease etc.  She is up to date on immunizations. [employer] employee.  Enjoys her job and has a good outlook.  She will continue to follow up with OB.GYN as scheduled and I will manage her primary care.  Discussed safety and health promotion.  Follow up pending labs.

## 2017-03-19 ENCOUNTER — Ambulatory Visit: Payer: Managed Care, Other (non HMO) | Admitting: Family Medicine

## 2017-03-19 ENCOUNTER — Encounter: Payer: Self-pay | Admitting: Family Medicine

## 2017-03-19 VITALS — BP 160/100 | HR 84 | Temp 98.4°F | Ht 64.0 in | Wt 232.4 lb

## 2017-03-19 DIAGNOSIS — J4520 Mild intermittent asthma, uncomplicated: Secondary | ICD-10-CM

## 2017-03-19 DIAGNOSIS — I10 Essential (primary) hypertension: Secondary | ICD-10-CM | POA: Diagnosis not present

## 2017-03-19 DIAGNOSIS — J209 Acute bronchitis, unspecified: Secondary | ICD-10-CM

## 2017-03-19 MED ORDER — AZITHROMYCIN 250 MG PO TABS: ORAL_TABLET | ORAL | 0 refills | Status: DC

## 2017-03-19 NOTE — Progress Notes (Signed)
Chief Complaint  Patient presents with  . Cough    x 1 week with tan colored mucus, no noted fevers. Has some SOB. No chills or body aches.     Subjective:  [name] is a 26 y.o. female who presents for a 6 day history of sore throat, cough, wheezing and now having mild shortness of breath has been using her albuterol inhaler 2-3 times per day for 3 days. Typically she does not need this inhaler. History of underlying asthma that is normally well controlled. States last year she had bronchitis.  No history of pneumonia.   Denies fever, chills, ear pain, rhinorrhea, nasal congestion, sore throat, body aches, chest pain, palpitations, abdominal pain, nausea, vomiting, diarrhea.  Denies history of hypertension and is aware that her blood pressure is elevated today.  LMP: 1 week ago.   Treatment to date: antihistamines, cough suppressants and decongestants. Positive sick contacts.  No other aggravating or relieving factors.  No other c/o.  ROS as in subjective.   Objective: Vitals:   03/19/17 1057  BP: (!) 160/100  Pulse: 84  Temp: 98.4 F (36.9 C)  SpO2: 96%    General appearance: Alert, WD/WN, no distress, mildly ill appearing                             Skin: warm, no rash                           Head: no sinus tenderness                            Eyes: conjunctiva normal, corneas clear, PERRLA                            Ears: pearly TMs, external ear canals normal                          Nose: septum midline, turbinates swollen, with erythema and clear discharge             Mouth/throat: MMM, tongue normal, mild pharyngeal erythema                           Neck: supple, no adenopathy, no thyromegaly, nontender                          Heart: RRR, normal S1, S2, no murmurs                         Lungs: CTA bilaterally, no wheezes, rales, or rhonchi. Normal work of breathing. Speaking in full sentences.       Assessment: Acute bronchitis, unspecified  organism  Mild intermittent asthma, unspecified whether complicated  Elevated blood pressure reading in office with diagnosis of hypertension    Plan: Discussed diagnosis and treatment of acute bronchitis.  She is needing her albuterol inhaler for wheezing and cough but is not having an acute flare.  No respiratory distress.  Suggested symptomatic OTC remedies.  Mucinex for cough and congestion.   Nasal saline spray for congestion.  Tylenol or Ibuprofen OTC for fever and malaise.  She will call or return if she is not back to  baseline day 10 after completing the antibiotic. Discussed that her blood pressure is elevated today and this may be due to recent use of cold medication and albuterol.  Advised her to check her blood pressure outside of here and if it is continuing to be elevated then she should return.  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

## 2017-03-20 ENCOUNTER — Other Ambulatory Visit: Payer: Self-pay | Admitting: Medical

## 2017-03-20 ENCOUNTER — Telehealth: Payer: Self-pay | Admitting: Family Medicine

## 2017-03-20 MED ORDER — FLUCONAZOLE 150 MG PO TABS: 150.0000 mg | ORAL_TABLET | Freq: Once | ORAL | 0 refills | Status: AC

## 2017-03-20 NOTE — Telephone Encounter (Signed)
Pt states every time she takes antibiotic she gets yeast infection and forgot to ask about Diflucan. She has taken #2 Azithromycin and already has a discharge.   She request Diflucan to [facility] [address]

## 2017-03-20 NOTE — Telephone Encounter (Signed)
Diflucan sent

## 2017-08-21 ENCOUNTER — Ambulatory Visit (INDEPENDENT_AMBULATORY_CARE_PROVIDER_SITE_OTHER): Payer: Managed Care, Other (non HMO) | Admitting: Family Medicine

## 2017-08-21 ENCOUNTER — Encounter: Payer: Self-pay | Admitting: Family Medicine

## 2017-08-21 VITALS — BP 128/84 | HR 85 | Temp 98.6°F | Resp 16 | Ht 65.0 in | Wt 229.2 lb

## 2017-08-21 DIAGNOSIS — J4520 Mild intermittent asthma, uncomplicated: Secondary | ICD-10-CM | POA: Diagnosis not present

## 2017-08-21 MED ORDER — ALBUTEROL SULFATE HFA 108 (90 BASE) MCG/ACT IN AERS: 2.0000 | INHALATION_SPRAY | Freq: Four times a day (QID) | RESPIRATORY_TRACT | 1 refills | Status: DC | PRN

## 2017-08-21 NOTE — Progress Notes (Signed)
   Subjective:    Patient ID: [name], female    DOB: 08/20/1990, 27 y.o.   MRN: [id number]  HPI Chief Complaint  Patient presents with  . other    wheezing , sob after working in the yard two weeks ago. history of asthma    History of asthma since 5th grade. States she has not had a rescue inhaler since HS and would like to get one.  States she was on Advair up until HS but no maintenance medications since.   States she got short of breath working in the yard last week and if she would have had an inhaler she would have used it. States symptoms resolved fairly quickly and no issues since.  Seasonal allergies and takes daily Claritin.   Denies fever, chills, dizziness, chest pain, palpitations, shortness of breath, abdominal pain, N/V/D, urinary symptoms, LE edema.  Reviewed allergies, medications, past medical, surgical, family, and social history.    Review of Systems Pertinent positives and negatives in the history of present illness.     Objective:   Physical Exam BP 128/84 (BP Location: Right Arm, Patient Position: Sitting)   Pulse 85   Temp 98.6 F (37 C)   Resp 16   Ht  (1.651 m)   Wt 229 lb 3.2 oz (104 kg)   SpO2 98%   BMI 38.14 kg/m   Alert and in no distress. Pharyngeal area is normal. Neck is supple without adenopathy or thyromegaly. Cardiac exam shows a regular sinus rhythm without murmurs or gallops. Lungs are clear to auscultation.       Assessment & Plan:  Mild intermittent asthma without complication  Albuterol inhaler sent to her pharmacy. Asthma well controlled but needs this in case of flare.  Follow up if needing inhaler more than once during the day per week and if needing at night more than once per month.  Continue treating seasonal allergies.

## 2017-09-25 LAB — OB RESULTS CONSOLE ANTIBODY SCREEN: Antibody Screen: NEGATIVE

## 2017-09-25 LAB — OB RESULTS CONSOLE RUBELLA ANTIBODY, IGM: RUBELLA: IMMUNE

## 2017-09-25 LAB — OB RESULTS CONSOLE ABO/RH: RH Type: POSITIVE

## 2017-09-25 LAB — OB RESULTS CONSOLE HIV ANTIBODY (ROUTINE TESTING): HIV: NONREACTIVE

## 2017-09-25 LAB — OB RESULTS CONSOLE RPR: RPR: NONREACTIVE

## 2017-09-25 LAB — OB RESULTS CONSOLE GC/CHLAMYDIA
Chlamydia: NEGATIVE
Gonorrhea: NEGATIVE

## 2017-09-25 LAB — OB RESULTS CONSOLE HEPATITIS B SURFACE ANTIGEN: HEP B S AG: NEGATIVE

## 2017-12-22 ENCOUNTER — Encounter (HOSPITAL_COMMUNITY): Payer: Self-pay

## 2017-12-23 ENCOUNTER — Other Ambulatory Visit (HOSPITAL_COMMUNITY): Payer: Self-pay | Admitting: Obstetrics and Gynecology

## 2017-12-23 DIAGNOSIS — Z369 Encounter for antenatal screening, unspecified: Secondary | ICD-10-CM

## 2017-12-24 ENCOUNTER — Encounter (HOSPITAL_COMMUNITY): Payer: Self-pay | Admitting: *Deleted

## 2017-12-28 ENCOUNTER — Ambulatory Visit (HOSPITAL_COMMUNITY)
Admission: RE | Admit: 2017-12-28 | Discharge: 2017-12-28 | Disposition: A | Discharge status: Home / Self Care | Payer: Managed Care, Other (non HMO) | Source: Ambulatory Visit | Attending: Obstetrics and Gynecology | Admitting: Obstetrics and Gynecology

## 2017-12-28 ENCOUNTER — Encounter (HOSPITAL_COMMUNITY): Payer: Self-pay

## 2017-12-28 ENCOUNTER — Other Ambulatory Visit: Payer: Self-pay

## 2017-12-28 DIAGNOSIS — O351XX0 Maternal care for (suspected) chromosomal abnormality in fetus, not applicable or unspecified: Secondary | ICD-10-CM | POA: Insufficient documentation

## 2017-12-28 DIAGNOSIS — Z363 Encounter for antenatal screening for malformations: Secondary | ICD-10-CM | POA: Insufficient documentation

## 2017-12-28 DIAGNOSIS — E669 Obesity, unspecified: Secondary | ICD-10-CM | POA: Diagnosis not present

## 2017-12-28 DIAGNOSIS — O99512 Diseases of the respiratory system complicating pregnancy, second trimester: Secondary | ICD-10-CM | POA: Insufficient documentation

## 2017-12-28 DIAGNOSIS — O34219 Maternal care for unspecified type scar from previous cesarean delivery: Secondary | ICD-10-CM

## 2017-12-28 DIAGNOSIS — Z3A25 25 weeks gestation of pregnancy: Secondary | ICD-10-CM | POA: Diagnosis not present

## 2017-12-28 DIAGNOSIS — O99212 Obesity complicating pregnancy, second trimester: Secondary | ICD-10-CM | POA: Diagnosis not present

## 2017-12-28 DIAGNOSIS — J45909 Unspecified asthma, uncomplicated: Secondary | ICD-10-CM | POA: Diagnosis not present

## 2017-12-28 DIAGNOSIS — Z369 Encounter for antenatal screening, unspecified: Secondary | ICD-10-CM

## 2017-12-28 NOTE — Consult Note (Signed)
Maternal-Fetal Medicine  [name] MR: [id number] Referring Provider: Philip Aspen, DO  I had the pleasure of seeing Ms. [name], G3 P1 at 25-weeks' gestation. She is here for ultrasound and consultation. She was accompanied by her husband. On your office ultrasound, cardiac and facial views could not be assessed because of maternal body habitus.  Obstetric history is significant for a term cesarean delivery (failure to progress in labor) in 2013 of a female infant weighing 6-9 at birth. She also had an early spontaneous miscarriage (no D&C) early this year. Gyn: Previous cycles irregular. No history of cervical surgeries. PMH: No history of hypertension or diabetes or any other chronic medical conditions. She reports she does not have sickle-cell trait. PSH: Cesarean section. Social: Denies tobacco or drug or alcohol use. She has been married 2 years and her husband (Tree surgeon) is in good health. He is also the father of their first child.  Family: No history of venous thromboembolism in the family. Prenatal: Patient reports she had low risk fetal aneuploidies on first-trimester screening.  We performed a fetal anatomy scan. 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 No other markers of aneuploidies or fetal structural defects are seen. Cardiac views are normal and facial anatomy including lips and nose are normal. Fetal biometry is consistent with her previously-established dates. Amniotic fluid is normal and good fetal activity is seen. Patient understands the limitations of ultrasound in detecting fetal anomalies.  Placenta is anterior and there is no evidence of previa or accreta.  I counseled the patient on the following:  Absent nasal bone:  I counseled the couple that absent nasal bone is a strong marker for Down syndrome (detection rate up to 30%). However, it can also be present in euploid fetus (less than 1%). In African-American population, delayed development of nasal bone is seen  in some cases.  Patient had first-trimester screening at your office that reportedly showed low risk for Down syndrome.  I discussed the following options: 1) Maternal blood cell-free fetal DNA screening  for trisomies 21, 18 and 13, which has a greater detection rate than conventional screening tests. I informed the patient that not all chromosomal malformations are detected by this test. 2) I informed her that only amniocentesis will give a definitive result on the fetal karyotype (procedure-related loss 1 in 400). Patient opted to have cell-free fetal DNA screening.  Blood was drawn for cell-free fetal DNA screening. We will communicate the results to the patient and fax a copy to your office.  Thank you for your consultation. If you have any questions or concerns, please call at the [facility].  Consultation including face-to-face counseling: 30 minutes.

## 2018-01-02 ENCOUNTER — Other Ambulatory Visit (HOSPITAL_COMMUNITY): Payer: Self-pay

## 2018-01-06 ENCOUNTER — Telehealth (HOSPITAL_COMMUNITY): Payer: Self-pay | Admitting: *Deleted

## 2018-01-06 NOTE — Telephone Encounter (Signed)
Pt called, then came by MFM for her panorama results.  Low risk female result given.  Copy of result to be faxed to her OB office.

## 2018-02-22 ENCOUNTER — Other Ambulatory Visit: Payer: Self-pay | Admitting: Obstetrics and Gynecology

## 2018-03-17 LAB — OB RESULTS CONSOLE HIV ANTIBODY (ROUTINE TESTING): HIV: NONREACTIVE

## 2018-03-26 ENCOUNTER — Encounter (HOSPITAL_COMMUNITY): Payer: Self-pay

## 2018-03-26 NOTE — Patient Instructions (Signed)
Laurie PandaMonique Roberson Streett  03/26/2018   Your procedure is scheduled on:  04/07/2018  [facility].  Tell desk you need to see admitting.  Call this number if you have problems the morning of surgery:[phone]  Remember:   Do not eat food:(After Midnight) Desps de medianoche.  Do not drink clear liquids: (After Midnight) Desps de medianoche.  Take these medicines the morning of surgery with A SIP OF WATER: none   Do not wear jewelry, make-up or nail polish.  Do not wear lotions, powders, or perfumes. Do not wear deodorant.  Do not shave 48 hours prior to surgery.  Do not bring valuables to the hospital.  [facility] is not   responsible for any belongings or valuables brought to the hospital.  Contacts, dentures or bridgework may not be worn into surgery.  Leave suitcase in the car. After surgery it may be brought to your room.  For patients admitted to the hospital, checkout time is 11:00 AM the day of              discharge.    N/A   Please read over the following fact sheets that you were given:   Surgical Site Infection Prevention

## 2018-03-27 ENCOUNTER — Inpatient Hospital Stay (HOSPITAL_COMMUNITY)
Admission: AD | Admit: 2018-03-27 | Discharge: 2018-03-29 | DRG: 788 | Disposition: A | Discharge status: Home / Self Care | Payer: BLUE CROSS/BLUE SHIELD | Attending: Obstetrics and Gynecology | Admitting: Obstetrics and Gynecology

## 2018-03-27 ENCOUNTER — Encounter (HOSPITAL_COMMUNITY)
Admission: AD | Disposition: A | Discharge status: Home / Self Care | Payer: Self-pay | Source: Home / Self Care | Attending: Obstetrics and Gynecology

## 2018-03-27 ENCOUNTER — Inpatient Hospital Stay (HOSPITAL_COMMUNITY): Payer: BLUE CROSS/BLUE SHIELD | Admitting: Certified Registered Nurse Anesthetist

## 2018-03-27 ENCOUNTER — Encounter (HOSPITAL_COMMUNITY): Payer: Self-pay

## 2018-03-27 DIAGNOSIS — O99214 Obesity complicating childbirth: Secondary | ICD-10-CM | POA: Diagnosis present

## 2018-03-27 DIAGNOSIS — O9952 Diseases of the respiratory system complicating childbirth: Secondary | ICD-10-CM | POA: Diagnosis present

## 2018-03-27 DIAGNOSIS — O133 Gestational [pregnancy-induced] hypertension without significant proteinuria, third trimester: Secondary | ICD-10-CM | POA: Diagnosis not present

## 2018-03-27 DIAGNOSIS — O134 Gestational [pregnancy-induced] hypertension without significant proteinuria, complicating childbirth: Principal | ICD-10-CM | POA: Diagnosis present

## 2018-03-27 DIAGNOSIS — O34211 Maternal care for low transverse scar from previous cesarean delivery: Secondary | ICD-10-CM | POA: Diagnosis present

## 2018-03-27 DIAGNOSIS — Z3A38 38 weeks gestation of pregnancy: Secondary | ICD-10-CM | POA: Diagnosis not present

## 2018-03-27 DIAGNOSIS — J45909 Unspecified asthma, uncomplicated: Secondary | ICD-10-CM | POA: Diagnosis present

## 2018-03-27 DIAGNOSIS — Z3490 Encounter for supervision of normal pregnancy, unspecified, unspecified trimester: Secondary | ICD-10-CM

## 2018-03-27 LAB — COMPREHENSIVE METABOLIC PANEL
ALT: 19 U/L (ref 0–44)
AST: 25 U/L (ref 15–41)
Albumin: 3.6 g/dL (ref 3.5–5.0)
Alkaline Phosphatase: 443 U/L — ABNORMAL HIGH (ref 38–126)
Anion gap: 10 (ref 5–15)
BUN: 7 mg/dL (ref 6–20)
CALCIUM: 8.9 mg/dL (ref 8.9–10.3)
CO2: 18 mmol/L — ABNORMAL LOW (ref 22–32)
Chloride: 107 mmol/L (ref 98–111)
Creatinine, Ser: 0.46 mg/dL (ref 0.44–1.00)
GFR calc Af Amer: >60 mL/min (ref >60)
GFR calc non Af Amer: >60 mL/min (ref >60)
GLUCOSE: 72 mg/dL (ref 70–99)
Potassium: 3.5 mmol/L (ref 3.5–5.1)
Sodium: 135 mmol/L (ref 135–145)
Total Bilirubin: 1 mg/dL (ref 0.3–1.2)
Total Protein: 7 g/dL (ref 6.5–8.1)

## 2018-03-27 LAB — CBC WITH DIFFERENTIAL/PLATELET
BASOS PCT: 0 %
Basophils Absolute: 0 10*3/uL (ref 0.0–0.1)
Eosinophils Absolute: 0.1 10*3/uL (ref 0.0–0.5)
Eosinophils Relative: 1 %
HEMATOCRIT: 36.7 % (ref 36.0–46.0)
Hemoglobin: 12.4 g/dL (ref 12.0–15.0)
Lymphocytes Relative: 21 %
Lymphs Abs: 2 10*3/uL (ref 0.7–4.0)
MCH: 30 pg (ref 26.0–34.0)
MCHC: 33.8 g/dL (ref 30.0–36.0)
MCV: 88.6 fL (ref 80.0–100.0)
MONO ABS: 0.4 10*3/uL (ref 0.1–1.0)
MONOS PCT: 4 %
Neutro Abs: 7.2 10*3/uL (ref 1.7–7.7)
Neutrophils Relative %: 74 %
Platelets: 170 10*3/uL (ref 150–400)
RBC: 4.14 MIL/uL (ref 3.87–5.11)
RDW: 14.1 % (ref 11.5–15.5)
WBC: 9.7 10*3/uL (ref 4.0–10.5)
nRBC: 0 % (ref 0.0–0.2)

## 2018-03-27 LAB — URINALYSIS, ROUTINE W REFLEX MICROSCOPIC
Bilirubin Urine: NEGATIVE
Glucose, UA: NEGATIVE mg/dL
Hgb urine dipstick: NEGATIVE
Ketones, ur: NEGATIVE mg/dL
LEUKOCYTES UA: NEGATIVE
Nitrite: NEGATIVE
PROTEIN: NEGATIVE mg/dL
Specific Gravity, Urine: 1.01 (ref 1.005–1.030)
pH: 6 (ref 5.0–8.0)

## 2018-03-27 LAB — PROTEIN / CREATININE RATIO, URINE
Creatinine, Urine: 141 mg/dL
Protein Creatinine Ratio: 0.09 mg/mg{Cre} (ref 0.00–0.15)
Total Protein, Urine: 13 mg/dL

## 2018-03-27 SURGERY — Surgical Case
Anesthesia: Spinal

## 2018-03-27 MED ORDER — OXYTOCIN 10 UNIT/ML IJ SOLN
INTRAVENOUS | Status: DC | PRN
  Administered 2018-03-27: 40 [IU] via INTRAVENOUS

## 2018-03-27 MED ORDER — LABETALOL HCL 5 MG/ML IV SOLN
20.0000 mg | INTRAVENOUS | Status: DC | PRN
  Administered 2018-03-27: 20 mg via INTRAVENOUS
  Filled 2018-03-27: qty 4

## 2018-03-27 MED ORDER — PHENYLEPHRINE 8 MG IN D5W 100 ML (0.08MG/ML) PREMIX OPTIME
INJECTION | INTRAVENOUS | Status: DC | PRN
  Administered 2018-03-27: 60 ug/min via INTRAVENOUS

## 2018-03-27 MED ORDER — LABETALOL HCL 5 MG/ML IV SOLN
40.0000 mg | INTRAVENOUS | Status: DC | PRN
  Administered 2018-03-27: 40 mg via INTRAVENOUS
  Filled 2018-03-27: qty 8

## 2018-03-27 MED ORDER — FAMOTIDINE IN NACL 20-0.9 MG/50ML-% IV SOLN
20.0000 mg | Freq: Once | INTRAVENOUS | Status: AC
  Administered 2018-03-27: 20 mg via INTRAVENOUS
  Filled 2018-03-27: qty 50

## 2018-03-27 MED ORDER — ONDANSETRON HCL 4 MG/2ML IJ SOLN
INTRAMUSCULAR | Status: AC
  Filled 2018-03-27: qty 2

## 2018-03-27 MED ORDER — MORPHINE SULFATE (PF) 0.5 MG/ML IJ SOLN
INTRAMUSCULAR | Status: DC | PRN
  Administered 2018-03-27: .15 mg via INTRATHECAL

## 2018-03-27 MED ORDER — BUPIVACAINE IN DEXTROSE 0.75-8.25 % IT SOLN
INTRATHECAL | Status: DC | PRN
  Administered 2018-03-27: 1.5 mL via INTRATHECAL

## 2018-03-27 MED ORDER — MEPERIDINE HCL 25 MG/ML IJ SOLN: 6.2500 mg | INTRAMUSCULAR | Status: DC | PRN

## 2018-03-27 MED ORDER — PROMETHAZINE HCL 25 MG/ML IJ SOLN
25.0000 mg | Freq: Once | INTRAMUSCULAR | Status: DC
  Filled 2018-03-27: qty 1

## 2018-03-27 MED ORDER — ONDANSETRON HCL 4 MG/2ML IJ SOLN
INTRAMUSCULAR | Status: DC | PRN
  Administered 2018-03-27: 4 mg via INTRAVENOUS

## 2018-03-27 MED ORDER — LACTATED RINGERS IV SOLN
INTRAVENOUS | Status: DC
  Administered 2018-03-27 (×2): via INTRAVENOUS

## 2018-03-27 MED ORDER — SODIUM CHLORIDE 0.9 % IR SOLN
Status: DC | PRN
  Administered 2018-03-27: 1000 mL

## 2018-03-27 MED ORDER — ACETAMINOPHEN 10 MG/ML IV SOLN
1000.0000 mg | Freq: Once | INTRAVENOUS | Status: DC | PRN
  Administered 2018-03-27: 1000 mg via INTRAVENOUS

## 2018-03-27 MED ORDER — CEFAZOLIN SODIUM-DEXTROSE 2-4 GM/100ML-% IV SOLN
2.0000 g | INTRAVENOUS | Status: AC
  Administered 2018-03-27: 2 g via INTRAVENOUS
  Administered 2018-03-27: 1 g via INTRAVENOUS

## 2018-03-27 MED ORDER — FENTANYL CITRATE (PF) 100 MCG/2ML IJ SOLN
INTRAMUSCULAR | Status: AC
  Filled 2018-03-27: qty 2

## 2018-03-27 MED ORDER — FENTANYL CITRATE (PF) 100 MCG/2ML IJ SOLN
100.0000 ug | INTRAMUSCULAR | Status: DC | PRN
  Administered 2018-03-27 (×2): 100 ug via INTRAVENOUS
  Filled 2018-03-27 (×2): qty 2

## 2018-03-27 MED ORDER — PROMETHAZINE HCL 25 MG/ML IJ SOLN: 6.2500 mg | INTRAMUSCULAR | Status: DC | PRN

## 2018-03-27 MED ORDER — SODIUM CHLORIDE 0.9% FLUSH: 3.0000 mL | INTRAVENOUS | Status: DC | PRN

## 2018-03-27 MED ORDER — ONDANSETRON HCL 4 MG/2ML IJ SOLN
4.0000 mg | Freq: Three times a day (TID) | INTRAMUSCULAR | Status: DC | PRN
  Filled 2018-03-27: qty 2

## 2018-03-27 MED ORDER — HYDROMORPHONE HCL 1 MG/ML IJ SOLN: 0.2500 mg | INTRAMUSCULAR | Status: DC | PRN

## 2018-03-27 MED ORDER — STERILE WATER FOR IRRIGATION IR SOLN
Status: DC | PRN
  Administered 2018-03-27: 1000 mL

## 2018-03-27 MED ORDER — ACETAMINOPHEN 10 MG/ML IV SOLN
INTRAVENOUS | Status: AC
  Administered 2018-03-27: 1000 mg via INTRAVENOUS
  Filled 2018-03-27: qty 100

## 2018-03-27 MED ORDER — NALOXONE HCL 0.4 MG/ML IJ SOLN: 0.4000 mg | INTRAMUSCULAR | Status: DC | PRN

## 2018-03-27 MED ORDER — MORPHINE SULFATE (PF) 0.5 MG/ML IJ SOLN
INTRAMUSCULAR | Status: AC
  Filled 2018-03-27: qty 10

## 2018-03-27 MED ORDER — FENTANYL CITRATE (PF) 100 MCG/2ML IJ SOLN
INTRAMUSCULAR | Status: DC | PRN
  Administered 2018-03-27: 15 ug via INTRATHECAL

## 2018-03-27 MED ORDER — HYDROCODONE-ACETAMINOPHEN 7.5-325 MG PO TABS: 1.0000 | ORAL_TABLET | Freq: Once | ORAL | Status: DC | PRN

## 2018-03-27 MED ORDER — HYDRALAZINE HCL 20 MG/ML IJ SOLN: 10.0000 mg | INTRAMUSCULAR | Status: DC | PRN

## 2018-03-27 MED ORDER — OXYTOCIN 10 UNIT/ML IJ SOLN
INTRAMUSCULAR | Status: AC
  Filled 2018-03-27: qty 4

## 2018-03-27 MED ORDER — BUTORPHANOL TARTRATE 1 MG/ML IJ SOLN
2.0000 mg | Freq: Once | INTRAMUSCULAR | Status: DC
  Filled 2018-03-27: qty 2

## 2018-03-27 MED ORDER — LABETALOL HCL 5 MG/ML IV SOLN: 80.0000 mg | INTRAVENOUS | Status: DC | PRN

## 2018-03-27 SURGICAL SUPPLY — 37 items
BENZOIN TINCTURE PRP APPL 2/3 (GAUZE/BANDAGES/DRESSINGS) ×3 IMPLANT
CHLORAPREP W/TINT 26ML (MISCELLANEOUS) ×3 IMPLANT
CLAMP CORD UMBIL (MISCELLANEOUS) IMPLANT
CLOSURE STERI STRIP 1/2 X4 (GAUZE/BANDAGES/DRESSINGS) ×3 IMPLANT
CLOTH BEACON ORANGE TIMEOUT ST (SAFETY) ×3 IMPLANT
DRSG OPSITE POSTOP 4X10 (GAUZE/BANDAGES/DRESSINGS) ×3 IMPLANT
ELECT REM PT RETURN 9FT ADLT (ELECTROSURGICAL) ×3
ELECTRODE REM PT RTRN 9FT ADLT (ELECTROSURGICAL) ×1 IMPLANT
EXTRACTOR VACUUM M CUP 4 TUBE (SUCTIONS) IMPLANT
EXTRACTOR VACUUM M CUP 4' TUBE (SUCTIONS)
GLOVE BIOGEL PI IND STRL 6.5 (GLOVE) ×1 IMPLANT
GLOVE BIOGEL PI IND STRL 7.0 (GLOVE) ×1 IMPLANT
GLOVE BIOGEL PI INDICATOR 6.5 (GLOVE) ×2
GLOVE BIOGEL PI INDICATOR 7.0 (GLOVE) ×2
GLOVE ECLIPSE 6.5 STRL STRAW (GLOVE) ×3 IMPLANT
GOWN STRL REUS W/TWL LRG LVL3 (GOWN DISPOSABLE) ×6 IMPLANT
KIT ABG SYR 3ML LUER SLIP (SYRINGE) IMPLANT
NEEDLE HYPO 25X5/8 SAFETYGLIDE (NEEDLE) IMPLANT
NS IRRIG 1000ML POUR BTL (IV SOLUTION) ×3 IMPLANT
PACK C SECTION WH (CUSTOM PROCEDURE TRAY) ×3 IMPLANT
PAD ABD 7.5X8 STRL (GAUZE/BANDAGES/DRESSINGS) IMPLANT
PAD OB MATERNITY 4.3X12.25 (PERSONAL CARE ITEMS) ×3 IMPLANT
PENCIL SMOKE EVAC W/HOLSTER (ELECTROSURGICAL) ×3 IMPLANT
RETRACTOR TRAXI PANNICULUS (MISCELLANEOUS) ×1 IMPLANT
RTRCTR C-SECT PINK 25CM LRG (MISCELLANEOUS) ×3 IMPLANT
SPONGE LAP 18X18 RF (DISPOSABLE) ×9 IMPLANT
SUT MON AB 2-0 CT1 27 (SUTURE) ×3 IMPLANT
SUT MON AB 4-0 PS1 27 (SUTURE) IMPLANT
SUT PDS AB 0 CTX 60 (SUTURE) IMPLANT
SUT PLAIN 2 0 XLH (SUTURE) ×3 IMPLANT
SUT VIC AB 0 CTX 36 (SUTURE) ×8
SUT VIC AB 0 CTX36XBRD ANBCTRL (SUTURE) ×4 IMPLANT
SUT VIC AB 4-0 KS 27 (SUTURE) IMPLANT
TOWEL OR 17X24 6PK STRL BLUE (TOWEL DISPOSABLE) ×3 IMPLANT
TRAXI PANNICULUS RETRACTOR (MISCELLANEOUS) ×2
TRAY FOLEY W/BAG SLVR 14FR LF (SET/KITS/TRAYS/PACK) ×3 IMPLANT
VACUUM CUP M-STYLE MYSTIC II (SUCTIONS) ×3 IMPLANT

## 2018-03-27 NOTE — Anesthesia Preprocedure Evaluation (Addendum)
Anesthesia Evaluation  Patient identified by MRN, date of birth, ID band Patient awake    Reviewed: Allergy & Precautions, NPO status , Patient's Chart, lab work & pertinent test results  Airway Mallampati: III  TM Distance: >3 FB Neck ROM: Full    Dental no notable dental hx. (+) Teeth Intact   Pulmonary asthma ,    Pulmonary exam normal breath sounds clear to auscultation       Cardiovascular hypertension, Pt. on medications Normal cardiovascular exam Rhythm:Regular Rate:Normal     Neuro/Psych negative neurological ROS  negative psych ROS   GI/Hepatic negative GI ROS, Neg liver ROS,   Endo/Other  Morbid obesity  Renal/GU      Musculoskeletal   Abdominal (+) + obese,   Peds  Hematology negative hematology ROS (+)   Anesthesia Other Findings   Reproductive/Obstetrics (+) Pregnancy                           Lab Results  Component Value Date   WBC 9.7 03/27/2018   HGB 12.4 03/27/2018   HCT 36.7 03/27/2018   MCV 88.6 03/27/2018   PLT 170 03/27/2018    Anesthesia Physical Anesthesia Plan  ASA: IV  Anesthesia Plan: Spinal   Post-op Pain Management:    Induction:   PONV Risk Score and Plan: 3 and Treatment may vary due to age or medical condition, Ondansetron and Dexamethasone  Airway Management Planned: Natural Airway  Additional Equipment:   Intra-op Plan:   Post-operative Plan:   Informed Consent: I have reviewed the patients History and Physical, chart, labs and discussed the procedure including the risks, benefits and alternatives for the proposed anesthesia with the patient or authorized representative who has indicated his/her understanding and acceptance.     Plan Discussed with: CRNA  Anesthesia Plan Comments: (Pt for c/s for PIH . New onset visual disturbances today (spots). Last ate apple at 1500.  BP170/ 110s on admission treated w 40mg  of labetalol pt  normotensive)       Anesthesia Quick Evaluation

## 2018-03-27 NOTE — Transfer of Care (Signed)
Immediate Anesthesia Transfer of Care Note  Patient: [name]  Procedure(s) Performed: CESAREAN SECTION (N/A )  Patient Location: PACU  Anesthesia Type:Spinal  Level of Consciousness: awake, alert , oriented and patient cooperative  Airway & Oxygen Therapy: Patient Spontanous Breathing  Post-op Assessment: Report given to RN and Post -op Vital signs reviewed and stable  Post vital signs: Reviewed and stable  Last Vitals:  Vitals Value Taken Time  BP    Temp    Pulse    Resp    SpO2      Last Pain:  Vitals:   03/27/18 1725  TempSrc: Oral  PainSc: 9       Patients Stated Pain Goal: 0 (03/27/18 1725)  Complications: No apparent anesthesia complications

## 2018-03-27 NOTE — H&P (Signed)
[name] is an 27 y.o. G3P1011 1343w1d black female who presents to the ER c/o ctxs. She is scheduled for a repeat c/s. She was found to not be in labor however she had several elevated B/Ps. She was observed for 4 hours to verify the dx of GHTN. GBS neg/nl NIPT/ nl OGTT Chief Complaint: HPI:  Past Medical History:  Diagnosis Date  . Asthma   . Seasonal allergies     Past Surgical History:  Procedure Laterality Date  . CESAREAN SECTION  03/31/2012   Procedure: CESAREAN SECTION;  Surgeon: Philip AspenSidney Callahan, DO;  Location: [facility];  Service: Obstetrics;  Laterality: N/A;  . NO PAST SURGERIES      Family History  Problem Relation Age of Onset  . Diabetes Paternal Grandfather   . Hypertension Maternal Grandmother    Social History:  reports that she has never smoked. She has never used smokeless tobacco. She reports that she does not drink alcohol or use drugs.  Allergies: No Known Allergies  Medications Prior to Admission  Medication Sig Dispense Refill  . albuterol (PROVENTIL HFA;VENTOLIN HFA) 108 (90 Base) MCG/ACT inhaler Inhale 2 puffs into the lungs every 6 (six) hours as needed for wheezing or shortness of breath. 1 Inhaler 1  . Prenatal Multivit-Min-Fe-FA (PRENATAL VITAMINS PO) Take by mouth.    . Pseudoephedrine-APAP-DM (DAYQUIL PO) Take 30 mLs by mouth as needed.         Blood pressure (!) 149/85, pulse 87, temperature 97.7 F (36.5 C), temperature source Oral, resp. rate 19, weight 118 kg, last menstrual period 07/03/2017, SpO2 100 %. General appearance: alert, cooperative and morbidly obese Abdomen: gravid/ non tender   Lab Results  Component Value Date   WBC 9.7 03/27/2018   HGB 12.4 03/27/2018   HCT 36.7 03/27/2018   MCV 88.6 03/27/2018   PLT 170 03/27/2018   Lab Results  Component Value Date   PREGTESTUR POSITIVE (A) 08/13/2011       There are no active problems to display for this patient.  IMP/ IUP at term         Morbid obesity    H.O. C/s          GHTN Plan/ C/S  ANDERSON,MARK E 03/27/2018, 7:53 PM

## 2018-03-27 NOTE — MAU Provider Note (Signed)
History     CSN: [id number]  Arrival date and time: 03/27/18 1712   First Provider Initiated Contact with Patient 03/27/18 1755      Chief Complaint  Patient presents with  . Contractions   HPI [name] is a 27 y.o. G3P1011 at [redacted]w[redacted]d who presents to MAU for labor check in the setting of planned repeat LTCS. She denies vaginal bleeding, leaking of fluid, decreased fetal movement, fever, falls, or recent illness.  She also denies SOB, RUQ pain, headache, fever, falls or recent illness.  Contractions This is a new problem, onset late this morning. Contraction pain is bilateral low abdomen radiating to back, 10/10, no aggravating or alleviating factors. She has not taken medication or tried other treatments for this pain. Patient discussed her symptoms with the on-call provider and elected to stay home until her contractions became more regular and intense.  Elevated Blood Pressure Patient denies history of elevated blood pressures. Endorses seeing dark spots in her field of vision beginning at 4pm today.  Patient states she ate a handful of apples at approximately 4:30pm today.  OB History    Gravida  3   Para  1   Term  1   Preterm  0   AB  1   Living  1     SAB  1   TAB  0   Ectopic  0   Multiple  0   Live Births  1           Past Medical History:  Diagnosis Date  . Asthma   . Seasonal allergies     Past Surgical History:  Procedure Laterality Date  . CESAREAN SECTION  03/31/2012   Procedure: CESAREAN SECTION;  Surgeon: Philip Aspen, DO;  Location: [facility];  Service: Obstetrics;  Laterality: N/A;  . NO PAST SURGERIES      Family History  Problem Relation Age of Onset  . Diabetes Paternal Grandfather   . Hypertension Maternal Grandmother     Social History   Tobacco Use  . Smoking status: Never Smoker  . Smokeless tobacco: Never Used  Substance Use Topics  . Alcohol use: No  . Drug use: No    Allergies: No Known  Allergies  Medications Prior to Admission  Medication Sig Dispense Refill Last Dose  . albuterol (PROVENTIL HFA;VENTOLIN HFA) 108 (90 Base) MCG/ACT inhaler Inhale 2 puffs into the lungs every 6 (six) hours as needed for wheezing or shortness of breath. 1 Inhaler 1 More than a month at Unknown time  . Prenatal Multivit-Min-Fe-FA (PRENATAL VITAMINS PO) Take by mouth.   03/26/2018 at Unknown time  . Pseudoephedrine-APAP-DM (DAYQUIL PO) Take 30 mLs by mouth as needed.   Not Taking    Review of Systems  Constitutional: Negative for chills, diaphoresis, fatigue and fever.  Respiratory: Negative for shortness of breath.   Gastrointestinal: Positive for abdominal pain.  Genitourinary: Negative for difficulty urinating, dysuria, flank pain, vaginal bleeding, vaginal discharge and vaginal pain.  Musculoskeletal: Negative for back pain and joint swelling.  Neurological: Negative for dizziness, syncope, weakness and headaches.  All other systems reviewed and are negative.  Physical Exam   Blood pressure (!) 189/121, pulse (!) 105, temperature 97.7 F (36.5 C), temperature source Oral, resp. rate 19, weight 118 kg, last menstrual period 07/03/2017, SpO2 100 %.  Physical Exam  Vitals reviewed. Constitutional: She is oriented to person, place, and time. She appears well-developed and well-nourished.  Cardiovascular: Normal rate, normal heart sounds, intact  distal pulses and normal pulses.  Respiratory: Effort normal and breath sounds normal.  GI: Soft. She exhibits no distension. There is no tenderness. There is no rebound and no guarding.  Genitourinary: Vagina normal. No vaginal discharge found.  Genitourinary Comments: Cervix closed/thick/posterior  Neurological: She is alert and oriented to person, place, and time. She has normal reflexes.  Skin: Skin is warm and dry.  Psychiatric: She has a normal mood and affect. Her behavior is normal. Judgment and thought content normal.    MAU Course/MDM    --Dr. Dareen PianoAnderson contacted immediately after initial assessment to discuss desired management of new onset elevated BP and visual disturbances at term --Per Dr. Dareen PianoAnderson, patient to remain in MAU until four hours s/p initial elevated BP --Dr. Emelda FearFerguson, MAU Attending notified of patient arrival and plan of care --Reactive fetal tracing: baseline 140, moderate variability, positive accelerations, no decelerations --Toco: irregular mild to moderate contractions q 2-5 minutes --Vertex position confirmed by bedside ultrasound  Patient Vitals for the past 24 hrs:  BP Temp Temp src Pulse Resp SpO2 Weight  03/27/18 1941 (!) 150/94 - - 89 - - -  03/27/18 1931 (!) 149/85 - - 87 - - -  03/27/18 1921 137/82 - - 85 - - -  03/27/18 1911 (!) 142/87 - - 85 - - -  03/27/18 1901 (!) 150/86 - - 92 - - -  03/27/18 1841 (!) 166/93 - - 91 - - -  03/27/18 1832 (!) 164/97 - - 91 - - -  03/27/18 1817 (!) 186/113 - - 100 - - -  03/27/18 1801 (!) 189/121 - - (!) 105 - - -  03/27/18 1747 (!) 146/105 - - 80 - - -  03/27/18 1725 (!) 154/89 97.7 F (36.5 C) Oral 94 19 100 % 118 kg    Results for orders placed or performed during the hospital encounter of 03/27/18 (from the past 24 hour(s))  CBC with Differential/Platelet     Status: None   Collection Time: 03/27/18  6:27 PM  Result Value Ref Range   WBC 9.7 4.0 - 10.5 K/uL   RBC 4.14 3.87 - 5.11 MIL/uL   Hemoglobin 12.4 12.0 - 15.0 g/dL   HCT 16.136.7 09.636.0 - 04.546.0 %   MCV 88.6 80.0 - 100.0 fL   MCH 30.0 26.0 - 34.0 pg   MCHC 33.8 30.0 - 36.0 g/dL   RDW 40.914.1 81.111.5 - 91.415.5 %   Platelets 170 150 - 400 K/uL   nRBC 0.0 0.0 - 0.2 %   Neutrophils Relative % 74 %   Neutro Abs 7.2 1.7 - 7.7 K/uL   Lymphocytes Relative 21 %   Lymphs Abs 2.0 0.7 - 4.0 K/uL   Monocytes Relative 4 %   Monocytes Absolute 0.4 0.1 - 1.0 K/uL   Eosinophils Relative 1 %   Eosinophils Absolute 0.1 0.0 - 0.5 K/uL   Basophils Relative 0 %   Basophils Absolute 0.0 0.0 - 0.1 K/uL  Comprehensive  metabolic panel     Status: Abnormal   Collection Time: 03/27/18  6:27 PM  Result Value Ref Range   Sodium 135 135 - 145 mmol/L   Potassium 3.5 3.5 - 5.1 mmol/L   Chloride 107 98 - 111 mmol/L   CO2 18 (L) 22 - 32 mmol/L   Glucose, Bld 72 70 - 99 mg/dL   BUN 7 6 - 20 mg/dL   Creatinine, Ser 7.820.46 0.44 - 1.00 mg/dL   Calcium 8.9 8.9 - 95.610.3 mg/dL  Total Protein 7.0 6.5 - 8.1 g/dL   Albumin 3.6 3.5 - 5.0 g/dL   AST 25 15 - 41 U/L   ALT 19 0 - 44 U/L   Alkaline Phosphatase 443 (H) 38 - 126 U/L   Total Bilirubin 1.0 0.3 - 1.2 mg/dL   GFR calc non Af Amer >60 >60 mL/min   GFR calc Af Amer >60 >60 mL/min   Anion gap 10 5 - 15  Urinalysis, Routine w reflex microscopic     Status: None   Collection Time: 03/27/18  6:55 PM  Result Value Ref Range   Color, Urine YELLOW YELLOW   APPearance CLEAR CLEAR   Specific Gravity, Urine 1.010 1.005 - 1.030   pH 6.0 5.0 - 8.0   Glucose, UA NEGATIVE NEGATIVE mg/dL   Hgb urine dipstick NEGATIVE NEGATIVE   Bilirubin Urine NEGATIVE NEGATIVE   Ketones, ur NEGATIVE NEGATIVE mg/dL   Protein, ur NEGATIVE NEGATIVE mg/dL   Nitrite NEGATIVE NEGATIVE   Leukocytes, UA NEGATIVE NEGATIVE  Protein / creatinine ratio, urine     Status: None   Collection Time: 03/27/18  6:55 PM  Result Value Ref Range   Creatinine, Urine 141.00 mg/dL   Total Protein, Urine 13 mg/dL   Protein Creatinine Ratio 0.09 0.00 - 0.15 mg/mg[Cre]    Assessment and Plan  --27 y.o. G3P1011 at [redacted]w[redacted]d  --Gestational Hypertension, negative PEC labs --Blood pressure responsive to IV Labetalol x 2 --Reactive fetal tracing --Per Dr Dareen Piano, prep for 9pm repeat LTCS  Roxy Cedar [place] Skalicky,CNM 03/27/2018, 8:14 PM

## 2018-03-27 NOTE — Anesthesia Procedure Notes (Signed)
Spinal  Patient location during procedure: OB Start time: 03/27/2018 9:34 PM End time: 03/27/2018 9:43 PM Staffing Anesthesiologist: Trevor IhaHouser, Stephen A, MD Performed: anesthesiologist  Preanesthetic Checklist Completed: patient identified, surgical consent, pre-op evaluation, timeout performed, IV checked, risks and benefits discussed and monitors and equipment checked Spinal Block Patient position: sitting Prep: site prepped and draped and DuraPrep Patient monitoring: heart rate, cardiac monitor, continuous pulse ox and blood pressure Approach: midline Location: L3-4 Injection technique: single-shot Needle Needle type: Pencan and Sprotte  Needle gauge: 27 G Needle length: 10 cm Needle insertion depth: 8 cm Assessment Sensory level: T4 Additional Notes  2 Attempt (s). Pt tolerated procedure well.

## 2018-03-27 NOTE — MAU Note (Signed)
Anesthesia notified states to give pt Pepcid.  OR care coordinator. House notified.

## 2018-03-27 NOTE — MAU Note (Signed)
Gabriel RungMonique Russ HaloRoberson Brinson is a 27 y.o. at 372w1d here in MAU reporting: +contractions Onset of complaint: started around 11am and has gotten progressively worse throughout the day Pain score: 9/10 Denies LOF Denies Vaginal Bleeding VE exam on Monday closed Scheduled repeat C/s. Patient states first c/s was for incompetent cervix Vitals:   03/27/18 1725  BP: (!) 154/89  Pulse: 94  Resp: 19  Temp: 97.7 F (36.5 C)  SpO2: 100%     +FM

## 2018-03-28 ENCOUNTER — Encounter (HOSPITAL_COMMUNITY): Payer: Self-pay

## 2018-03-28 DIAGNOSIS — Z3490 Encounter for supervision of normal pregnancy, unspecified, unspecified trimester: Secondary | ICD-10-CM

## 2018-03-28 LAB — CBC
HCT: 28.7 % — ABNORMAL LOW (ref 36.0–46.0)
Hemoglobin: 9.5 g/dL — ABNORMAL LOW (ref 12.0–15.0)
MCH: 29.7 pg (ref 26.0–34.0)
MCHC: 33.4 g/dL (ref 30.0–36.0)
MCV: 88.9 fL (ref 80.0–100.0)
Platelets: 136 10*3/uL — ABNORMAL LOW (ref 150–400)
RBC: 3.23 MIL/uL — ABNORMAL LOW (ref 3.87–5.11)
RDW: 13.9 % (ref 11.5–15.5)
WBC: 8.7 10*3/uL (ref 4.0–10.5)
nRBC: 0 % (ref 0.0–0.2)

## 2018-03-28 MED ORDER — OXYCODONE-ACETAMINOPHEN 5-325 MG PO TABS: 2.0000 | ORAL_TABLET | ORAL | Status: DC | PRN

## 2018-03-28 MED ORDER — MEASLES, MUMPS & RUBELLA VAC IJ SOLR
0.5000 mL | Freq: Once | INTRAMUSCULAR | Status: DC
  Filled 2018-03-28: qty 0.5

## 2018-03-28 MED ORDER — WITCH HAZEL-GLYCERIN EX PADS: 1.0000 "application " | MEDICATED_PAD | CUTANEOUS | Status: DC | PRN

## 2018-03-28 MED ORDER — SENNOSIDES-DOCUSATE SODIUM 8.6-50 MG PO TABS
2.0000 | ORAL_TABLET | ORAL | Status: DC
  Administered 2018-03-29: 2 via ORAL
  Filled 2018-03-28: qty 2

## 2018-03-28 MED ORDER — LACTATED RINGERS IV SOLN
INTRAVENOUS | Status: DC
  Administered 2018-03-28: 999 mL via INTRAVENOUS

## 2018-03-28 MED ORDER — NALBUPHINE HCL 10 MG/ML IJ SOLN
5.0000 mg | INTRAMUSCULAR | Status: DC | PRN
  Administered 2018-03-28: 5 mg via SUBCUTANEOUS
  Filled 2018-03-28: qty 1

## 2018-03-28 MED ORDER — KETOROLAC TROMETHAMINE 30 MG/ML IJ SOLN
30.0000 mg | Freq: Four times a day (QID) | INTRAMUSCULAR | Status: AC | PRN
  Administered 2018-03-28 (×2): 30 mg via INTRAVENOUS
  Filled 2018-03-28 (×2): qty 1

## 2018-03-28 MED ORDER — ALBUTEROL SULFATE (2.5 MG/3ML) 0.083% IN NEBU: 3.0000 mL | INHALATION_SOLUTION | Freq: Four times a day (QID) | RESPIRATORY_TRACT | Status: DC | PRN

## 2018-03-28 MED ORDER — NALOXONE HCL 4 MG/10ML IJ SOLN
1.0000 ug/kg/h | INTRAVENOUS | Status: DC | PRN
  Filled 2018-03-28: qty 5

## 2018-03-28 MED ORDER — TETANUS-DIPHTH-ACELL PERTUSSIS 5-2.5-18.5 LF-MCG/0.5 IM SUSP: 0.5000 mL | Freq: Once | INTRAMUSCULAR | Status: DC

## 2018-03-28 MED ORDER — NALBUPHINE HCL 10 MG/ML IJ SOLN
5.0000 mg | Freq: Once | INTRAMUSCULAR | Status: AC | PRN
  Administered 2018-03-28: 5 mg via INTRAVENOUS

## 2018-03-28 MED ORDER — LACTATED RINGERS IV BOLUS
500.0000 mL | Freq: Once | INTRAVENOUS | Status: AC
  Administered 2018-03-28: 500 mL via INTRAVENOUS

## 2018-03-28 MED ORDER — SIMETHICONE 80 MG PO CHEW
80.0000 mg | CHEWABLE_TABLET | ORAL | Status: DC
  Administered 2018-03-28: 80 mg via ORAL
  Filled 2018-03-28: qty 1

## 2018-03-28 MED ORDER — IBUPROFEN 600 MG PO TABS
600.0000 mg | ORAL_TABLET | Freq: Four times a day (QID) | ORAL | Status: DC
  Administered 2018-03-29 (×4): 600 mg via ORAL
  Filled 2018-03-28 (×4): qty 1

## 2018-03-28 MED ORDER — NALBUPHINE HCL 10 MG/ML IJ SOLN: 5.0000 mg | Freq: Once | INTRAMUSCULAR | Status: AC | PRN

## 2018-03-28 MED ORDER — COCONUT OIL OIL
1.0000 "application " | TOPICAL_OIL | Status: DC | PRN
  Administered 2018-03-29: 1 via TOPICAL
  Filled 2018-03-28: qty 120

## 2018-03-28 MED ORDER — ZOLPIDEM TARTRATE 5 MG PO TABS: 5.0000 mg | ORAL_TABLET | Freq: Every evening | ORAL | Status: DC | PRN

## 2018-03-28 MED ORDER — OXYCODONE-ACETAMINOPHEN 5-325 MG PO TABS
1.0000 | ORAL_TABLET | ORAL | Status: DC | PRN
  Administered 2018-03-28 – 2018-03-29 (×4): 1 via ORAL
  Filled 2018-03-28 (×4): qty 1

## 2018-03-28 MED ORDER — NALBUPHINE HCL 10 MG/ML IJ SOLN
5.0000 mg | INTRAMUSCULAR | Status: DC | PRN
  Filled 2018-03-28: qty 1

## 2018-03-28 MED ORDER — KETOROLAC TROMETHAMINE 30 MG/ML IJ SOLN: 30.0000 mg | Freq: Four times a day (QID) | INTRAMUSCULAR | Status: AC | PRN

## 2018-03-28 MED ORDER — SCOPOLAMINE 1 MG/3DAYS TD PT72
1.0000 | MEDICATED_PATCH | Freq: Once | TRANSDERMAL | Status: DC
  Filled 2018-03-28: qty 1

## 2018-03-28 MED ORDER — OXYCODONE-ACETAMINOPHEN 5-325 MG PO TABS: 1.0000 | ORAL_TABLET | ORAL | Status: DC | PRN

## 2018-03-28 MED ORDER — DIPHENHYDRAMINE HCL 50 MG/ML IJ SOLN: 12.5000 mg | INTRAMUSCULAR | Status: DC | PRN

## 2018-03-28 MED ORDER — DIPHENHYDRAMINE HCL 25 MG PO CAPS: 25.0000 mg | ORAL_CAPSULE | ORAL | Status: DC | PRN

## 2018-03-28 MED ORDER — OXYTOCIN 40 UNITS IN LACTATED RINGERS INFUSION - SIMPLE MED: 2.5000 [IU]/h | INTRAVENOUS | Status: AC

## 2018-03-28 MED ORDER — PRENATAL MULTIVITAMIN CH
1.0000 | ORAL_TABLET | Freq: Every day | ORAL | Status: DC
  Administered 2018-03-28 – 2018-03-29 (×2): 1 via ORAL
  Filled 2018-03-28 (×2): qty 1

## 2018-03-28 MED ORDER — SIMETHICONE 80 MG PO CHEW
80.0000 mg | CHEWABLE_TABLET | Freq: Four times a day (QID) | ORAL | Status: DC
  Administered 2018-03-29 (×4): 80 mg via ORAL
  Filled 2018-03-28 (×4): qty 1

## 2018-03-28 MED ORDER — DIBUCAINE 1 % RE OINT: 1.0000 "application " | TOPICAL_OINTMENT | RECTAL | Status: DC | PRN

## 2018-03-28 NOTE — Anesthesia Postprocedure Evaluation (Signed)
Anesthesia Post Note  Patient: [name]  Procedure(s) Performed: CESAREAN SECTION (N/A )     Patient location during evaluation: Mother Baby Anesthesia Type: Spinal Level of consciousness: oriented and awake and alert Pain management: pain level controlled Vital Signs Assessment: post-procedure vital signs reviewed and stable Respiratory status: spontaneous breathing and respiratory function stable Cardiovascular status: blood pressure returned to baseline and stable Postop Assessment: no headache, no backache, no apparent nausea or vomiting and able to ambulate Anesthetic complications: no    Last Vitals:  Vitals:   03/28/18 0018 03/28/18 0135  BP: 120/86 136/77  Pulse: 80 79  Resp: 16 16  Temp: 36.6 C 36.6 C  SpO2: 98% 99%    Last Pain:  Vitals:   03/28/18 0135  TempSrc: Axillary  PainSc:    Pain Goal: Patients Stated Pain Goal: 0 (03/27/18 2355)               Trevor IhaStephen A Lyrick Lagrand

## 2018-03-28 NOTE — Anesthesia Postprocedure Evaluation (Signed)
Anesthesia Post Note  Patient: [name]  Procedure(s) Performed: CESAREAN SECTION (N/A )     Patient location during evaluation: Mother Baby Anesthesia Type: Spinal Level of consciousness: awake and alert Pain management: pain level controlled Vital Signs Assessment: post-procedure vital signs reviewed and stable Respiratory status: spontaneous breathing, nonlabored ventilation and respiratory function stable Cardiovascular status: stable Postop Assessment: no headache, no backache, spinal receding, able to ambulate, adequate PO intake, no apparent nausea or vomiting and patient able to bend at knees Anesthetic complications: no    Last Vitals:  Vitals:   03/28/18 0253 03/28/18 0409  BP: (!) 149/83   Pulse: 69   Resp: 16 16  Temp: 36.4 C 36.6 C  SpO2: 98% 99%    Last Pain:  Vitals:   03/28/18 0409  TempSrc: Oral  PainSc:    Pain Goal: Patients Stated Pain Goal: 0 (03/27/18 2355)               Laban EmperorMalinova,Graesyn Schreifels Hristova

## 2018-03-28 NOTE — Addendum Note (Signed)
Addendum  created 03/28/18 [id number] by Elgie CongoMalinova, Nnenna Meador H, CRNA   Sign clinical note

## 2018-03-28 NOTE — Progress Notes (Signed)
Noted low urine output when patient arrived from PACU. Notified Dr Dareen PianoAnderson. New order for 500 ml bolus of LR.

## 2018-03-28 NOTE — Lactation Note (Signed)
This note was copied from a baby's chart. Lactation Consultation Note:  P2 , Infant is 4815 hours old.  Mother reports that infant has had several good feedings.  Mother is able to hand express colostrum.  Reviewed basics of breastfeeding. Mother reports that she took a breastfeeding class with this pregnancy.   Assist mother with STS and positioning infant in football hold. Infant latched on for 15 mins.  Observed suckling and swallows .  Mother advised to breastfeed infant on cue and feed at least 8-12 times in 24 hours.  Discussed cluster feeding.   [facility]  Patient Name: [name] UJWJX'BToday's Date: 03/28/2018 Reason for consult: Initial assessment   Maternal Data Has patient been taught Hand Expression?: Yes Does the patient have breastfeeding experience prior to this delivery?: Yes  Feeding Feeding Type: Breast Fed  LATCH Score Latch: Grasps breast easily, tongue down, lips flanged, rhythmical sucking.  Audible Swallowing: A few with stimulation  Type of Nipple: Everted at rest and after stimulation  Comfort (Breast/Nipple): Soft / non-tender  Hold (Positioning): No assistance needed to correctly position infant at breast.  LATCH Score: 9  Interventions Interventions: Breast feeding basics reviewed;Assisted with latch;Skin to skin;Breast compression;Adjust position;Support pillows;Position options;Expressed milk  Lactation Tools Discussed/Used     Consult Status Consult Status: Follow-up Date: 03/29/18 Follow-up type: In-patient    Stevan BornKendrick, Abrianna Sidman [facility] 03/28/2018, 2:06 PM

## 2018-03-28 NOTE — Lactation Note (Signed)
This note was copied from a baby's chart. Lactation Consultation Note  Patient Name: Dawn Winfred BurnMonique Burgett BMWUX'LToday's Date: 03/28/2018 Reason for consult: Follow-up assessment;Mother's request   Mom requested LC.  Mom unsure about latch.  Desires LC to observe latch.  LC arranged support pillows and folded blanket under mom's breast.  Mom hand expressed colostrum easily.  Mom latched infant in football hold, infant sustained latch for 20 minutes, LC taught dad how to lift breast to tug at infant chin in order to check for flanged lip which mom said added to comfort level.  Mom states it feels like a tug.  Swallows heard with massage.   LC assisted in hand expressing 2ml into spoon; taught parents how to spoon feed because mom states sometimes it's hard to get him to latch at first.  Mom was very excited to see how quick and easily the colostrum was expressed.  Infant extended tongue well to take the breastmilk from the spoon. Infant has labial frenulum that extends to gum line but lips are able to flange with latch.  Mom denies further questions, breastfeeding basics reviewed. Colostrum containers provided. LC encouraged mom to call out for further assistance.    Maternal Data    Feeding Feeding Type: Breast Fed  LATCH Score Latch: Grasps breast easily, tongue down, lips flanged, rhythmical sucking.  Audible Swallowing: A few with stimulation  Type of Nipple: Everted at rest and after stimulation  Comfort (Breast/Nipple): Soft / non-tender  Hold (Positioning): Assistance needed to correctly position infant at breast and maintain latch.  LATCH Score: 8  Interventions Interventions: Breast feeding basics reviewed;Assisted with latch;Skin to skin;Breast massage;Hand express;Support pillows;Position options;Expressed milk  Lactation Tools Discussed/Used     Consult Status Consult Status: Follow-up Date: 03/29/18 Follow-up type: In-patient    [name] 03/28/2018, 9:55  PM

## 2018-03-28 NOTE — Progress Notes (Addendum)
POD#1 Pt without complaints. Lochia wnl VS - occ mild elevation of b/ps Urine output adequate but no diuresis yet Labs ok IMP/ Stable Plan/ Routine care

## 2018-03-28 NOTE — Op Note (Signed)
[name], [name] ROBERSON MEDICAL RECORD [id number] ACCOUNT [id number] DATE OF BIRTH:01-Dec-1990 FACILITY: [facility] LOCATION: [id number] Evert KohlPHYSICIAN:Samaad Hashem E. Ethanael Veith, MD  OPERATIVE REPORT  DATE OF PROCEDURE:  03/27/2018  PREOPERATIVE DIAGNOSES: 1.  Intrauterine pregnancy at 6138 weeks estimated gestational age. 2.  History of prior cesarean section. 3.  The patient declines vaginal birth after cesarean. 4.  Gestational hypertension. 5.  Morbid obesity.  POSTOPERATIVE DIAGNOSES: 1.  Intrauterine pregnancy at 4638 weeks estimated gestational age. 2.  History of prior cesarean section. 3.  The patient declines vaginal birth after cesarean. 4.  Gestational hypertension. 5.  Morbid obesity.  PROCEDURE:  Repeat low transverse cesarean section.  SURGEON:  Malva LimesMark Euphemia Lingerfelt, MD  ANESTHESIA:  Spinal.  ANTIBIOTICS:  Ancef 3 grams.  DRAINS:  Foley to bedside drainage.  ESTIMATED BLOOD LOSS:  900 mL.  COMPLICATIONS:  None.  FINDINGS:  The patient had normal fallopian tubes and ovaries bilaterally.  There was moderate meconium noted on entering the amniotic sac.  The infant was delivered in the vertex presentation.  DESCRIPTION OF PROCEDURE:  The patient was taken to the operating room where she was placed in a sitting position.  A spinal anesthetic was administered without difficulty.  She was then placed in the dorsal supine position with a left lateral tilt.   Once an adequate level of anesthetic was reached, she was prepped and draped in the usual fashion for this procedure.  A Pfannenstiel incision was made through the previous scar on entering the abdominal cavity, the bladder flap was taken down with sharp  dissection.  A low transverse uterine incision was made in the midline with the Metzenbaum scissors.  The uterine incision was extended laterally.  The infant was delivered in the vertex presentation with the assistance of a vacuum extractor.  On  delivery of the head the oropharynx  and nostrils were bulb suctioned.  The remaining infant was then delivered.  There was a nuchal cord x1.  After delivery of the infant, the umbilical cord was clamped x2 and then transected.  The infant was handed to  the awaiting NICU team.  Placenta was then manually removed.  Uterine cavity was wiped with a wet lap and examined.  The uterine incision was closed in a single layer of 0 Monocryl suture in a running fashion.  Bladder flap was not repaired.  The  parietal peritoneum and rectus muscles were reapproximated in the midline using 2-0 Monocryl in a running fashion.  The fascia was closed using 0 Monocryl suture in a running fashion.  Subcuticular tissue was irrigated, made hemostatic with the Bovie,  then closed with interrupted 2-0 plain gut suture.  The skin was closed with 3-0 Vicryl in a subcuticular fashion followed by Steri-Strips.   The patient was taken to the recovery room in stable condition.  Instrument and lap count was correct x3.  AN/NUANCE  D:03/27/2018 T:03/28/2018 JOB:[id number]

## 2018-03-29 ENCOUNTER — Encounter (HOSPITAL_COMMUNITY): Payer: Self-pay | Admitting: Obstetrics and Gynecology

## 2018-03-29 MED ORDER — OXYCODONE-ACETAMINOPHEN 5-325 MG PO TABS: 2.0000 | ORAL_TABLET | ORAL | 0 refills | Status: DC | PRN

## 2018-03-29 NOTE — Lactation Note (Signed)
This note was copied from a baby's chart. Lactation Consultation Note  Patient Name: [name] ZOXWR'UToday's Date: 03/29/2018   Lactation visit attempted, but infant getting pictures done.      Aileen FasspRichey, Eliyah Mcshea [facility] 03/29/2018, 2:32 PM

## 2018-03-29 NOTE — Lactation Note (Signed)
This note was copied from a baby's chart. Lactation Consultation Note  Patient Name: Dawn Winfred BurnMonique Sager ZOXWR'UToday's Date: 03/29/2018   Infant was finishing a feeding as I entered the room. He seemed satiated. Mom is able to identify the sound of swallows. Infant has had 2 stools since our morning consult.   Mom & baby will be going home soon. I suggested the following:  1. Offer breast, listening for swallows. Do breast compressions as needed to increase swallows.  2. After feeding, pump and hand express and feed resulting EBM to infant.  3. 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   Mom does state that she feels comfortable going home. Mom has our number to call, if any questions were to arise. [facility]   [name], Audre Cenci [facility] 03/29/2018, 3:09 PM

## 2018-03-29 NOTE — Discharge Summary (Signed)
Obstetric Discharge Summary Reason for Admission: cesarean section and gestational hypertension Prenatal Procedures: Preeclampsia and ultrasound Intrapartum Procedures: cesarean: low cervical, transverse Postpartum Procedures: none Complications-Operative and Postpartum: none Hemoglobin  Date Value Ref Range Status  03/28/2018 9.5 (L) 12.0 - 15.0 g/dL Final    Comment:    REPEATED TO VERIFY DELTA CHECK NOTED    HCT  Date Value Ref Range Status  03/28/2018 28.7 (L) 36.0 - 46.0 % Final    Physical Exam:  General: alert and cooperative Lochia: appropriate Uterine Fundus: firm Incision: healing well, no significant drainage DVT Evaluation: No evidence of DVT seen on physical exam.  Discharge Diagnoses: Term Pregnancy-delivered  Discharge Information: Date: 03/29/2018 Activity: pelvic rest Diet: routine Medications: PNV, Ibuprofen and Percocet Condition: stable Instructions: refer to practice specific booklet Discharge to: home Follow-up Information    Levi AlandAnderson, Mark E, MD Follow up in 1 week(s).   Specialty:  Obstetrics and Gynecology Why:  BP check Contact information: [address] [place] [id number] [phone]           Newborn Data: Live born female  Birth Weight: 8 lb 2.5 oz (3700 g) APGAR: 9, 9  Newborn Delivery   Birth date/time:  03/27/2018 22:05:00 Delivery type:  C-Section, Vacuum Assisted Trial of labor:  No C-section categorization:  Repeat     Home with mother.  Philip AspenCALLAHAN, Opie Maclaughlin 03/29/2018, 10:27 AM

## 2018-03-29 NOTE — Lactation Note (Addendum)
This note was copied from a baby's chart. Lactation Consultation Note  Patient Name: [name] ZOXWR'UToday's Date: 03/29/2018 Reason for consult: Follow-up assessment  Mom is a P2. She nursed her 636 yo for 2 months. Mom admits to not trying for longer b/c she had to use a nipple shield (for inverted nipples). Mom no longer has inverted nipples. Mom reports that her milk came to volume around the 4th day, but she never felt like her supply was adequate. Mom reports that she supplemented her 1st child with formula from birth onward.   "[name]" latched with ease to breast. Some swallowing was noted at breast, but not continuous. Hand expression was done on the other breast to increase flow rate, and then infant was spoon-fed after feeding. Until we get results of TcB, I suggested that we "top off" infant after every feeding.  Infant has good tongue mobility. Labial frenum that bifurcates top gum noted. Infant needing assistance with flanging top lip.   Mom with blood loss of 900mL.  [name], [name] [facility] 03/29/2018, 10:42 AM

## 2018-03-29 NOTE — Lactation Note (Signed)
This note was copied from a baby's chart. Lactation Consultation Note  Patient Name: [name] ZOXWR'UToday's Date: 03/29/2018 Reason for consult: Follow-up assessment;Early term 37-38.6wks P1, 32 hr female infant. Per mom, infant cluster feed last night. Mom is feeling more confident in her BF abilities. Breast are a little sore but mom doesn't have nipple trauma. Nurse will give mom coconut oil. Infant asleep in basinet. LC did not assess latch at this time.  [facility]. LC discussed engorgement treatment and prevention.  Reviewed  with Mom, O/P services, breastfeeding support groups, community resources, and our phone # for post-discharge questions.  Maternal Data    Feeding    LATCH Score                   Interventions    Lactation Tools Discussed/Used     Consult Status Consult Status: Complete Date: 03/29/18    Danelle EarthlyRobin Ossiel Marchio 03/29/2018, 6:38 AM

## 2018-03-31 ENCOUNTER — Telehealth (HOSPITAL_COMMUNITY): Payer: Self-pay

## 2018-03-31 NOTE — Telephone Encounter (Signed)
[facility] they were breastfeeding well, but once she got home her nipples started becoming painful and cracked and have sores on them.  Baby boy [name] is now 873 days old mom reports and will be 704 days old at 10 pm tonight.  Has had 1 void and 1 stool today. Infant crying while mom talking with me.  Mom reports breast fullness but reports she does not feel she is engorged.  Mom reports areolar tissue compressable. Urged mom to try and feed as soon as he cues and 8 or more times day.  Urged mom to massage and do RPS on areola to help nipple evert and push fluid back.  Urged mom to prepump prior to latching to help prime ducts and get milk flowing or hand express prior to latch when does RPS if not able to prepump.  Mom has DEBP.  Discussed pumping past breastfeeds and feeding back all EBM if infant is not getting enough. Mom following up with outpatient lactation tomorrow at 2 pm.

## 2018-04-06 ENCOUNTER — Encounter (HOSPITAL_COMMUNITY)
Admission: RE | Admit: 2018-04-06 | Discharge: 2018-04-06 | Disposition: A | Discharge status: Home / Self Care | Payer: No Typology Code available for payment source | Source: Ambulatory Visit

## 2018-04-07 ENCOUNTER — Inpatient Hospital Stay (HOSPITAL_COMMUNITY): Admit: 2018-04-07 | Payer: No Typology Code available for payment source | Admitting: Obstetrics and Gynecology

## 2018-05-12 ENCOUNTER — Encounter (HOSPITAL_COMMUNITY): Payer: Self-pay

## 2018-05-12 ENCOUNTER — Inpatient Hospital Stay (HOSPITAL_COMMUNITY)
Admission: AD | Admit: 2018-05-12 | Discharge: 2018-05-12 | Disposition: A | Discharge status: Home / Self Care | Payer: Medicaid Other | Attending: Obstetrics and Gynecology | Admitting: Obstetrics and Gynecology

## 2018-05-12 DIAGNOSIS — O1003 Pre-existing essential hypertension complicating the puerperium: Secondary | ICD-10-CM | POA: Insufficient documentation

## 2018-05-12 DIAGNOSIS — O165 Unspecified maternal hypertension, complicating the puerperium: Secondary | ICD-10-CM | POA: Diagnosis not present

## 2018-05-12 MED ORDER — LABETALOL HCL 5 MG/ML IV SOLN: 40.0000 mg | INTRAVENOUS | Status: DC | PRN

## 2018-05-12 MED ORDER — ENALAPRIL MALEATE 5 MG PO TABS: 5.0000 mg | ORAL_TABLET | Freq: Every day | ORAL | 3 refills | Status: DC

## 2018-05-12 MED ORDER — NIFEDIPINE 10 MG PO CAPS
20.0000 mg | ORAL_CAPSULE | ORAL | Status: DC | PRN
  Filled 2018-05-12: qty 2

## 2018-05-12 MED ORDER — NIFEDIPINE 10 MG PO CAPS
10.0000 mg | ORAL_CAPSULE | ORAL | Status: DC | PRN
  Administered 2018-05-12: 10 mg via ORAL
  Filled 2018-05-12: qty 1

## 2018-05-12 MED ORDER — NIFEDIPINE 10 MG PO CAPS: 20.0000 mg | ORAL_CAPSULE | ORAL | Status: DC | PRN

## 2018-05-12 MED ORDER — NIFEDIPINE ER OSMOTIC RELEASE 30 MG PO TB24: 30.0000 mg | ORAL_TABLET | Freq: Every day | ORAL | 3 refills | Status: DC

## 2018-05-12 MED ORDER — ENALAPRIL MALEATE 5 MG PO TABS
5.0000 mg | ORAL_TABLET | Freq: Once | ORAL | Status: AC
  Administered 2018-05-12: 5 mg via ORAL
  Filled 2018-05-12: qty 1

## 2018-05-12 NOTE — Discharge Instructions (Signed)
Postpartum Hypertension  Postpartum hypertension is high blood pressure that remains higher than normal after childbirth. You may not realize that you have postpartum hypertension if your blood pressure is not being checked regularly. In most cases, postpartum hypertension will go away on its own, usually within a week of delivery. However, for some women, medical treatment is required to prevent serious complications, such as seizures or stroke.  What are the causes?  This condition may be caused by one or more of the following:   Hypertension that existed before pregnancy (chronic hypertension).   Hypertension that comes on as a result of pregnancy (gestational hypertension).   Hypertensive disorders during pregnancy (preeclampsia) or seizures in women who have high blood pressure during pregnancy (eclampsia).   A condition in which the liver, platelets, and red blood cells are damaged during pregnancy (HELLP syndrome).   A condition in which the thyroid produces too much hormones (hyperthyroidism).   Other rare problems of the nerves (neurological disorders) or blood disorders.  In some cases, the cause may not be known.  What increases the risk?  The following factors may make you more likely to develop this condition:   Chronic hypertension. In some cases, this may not have been diagnosed before pregnancy.   Obesity.   Type 2 diabetes.   Kidney disease.   History of preeclampsia or eclampsia.   Other medical conditions that change the level of hormones in the body (hormonal imbalance).  What are the signs or symptoms?  As with all types of hypertension, postpartum hypertension may not have any symptoms. Depending on how high your blood pressure is, you may experience:   Headaches. These may be mild, moderate, or severe. They may also be steady, constant, or sudden in onset (thunderclap headache).   Changes in your ability to see (visual changes).   Dizziness.   Shortness of breath.   Swelling  of your hands, feet, lower legs, or face. In some cases, you may have swelling in more than one of these locations.   Heart palpitations or a racing heartbeat.   Difficulty breathing while lying down.   Decrease in the amount of urine that you pass.  Other rare signs and symptoms may include:   Sweating more than usual. This lasts longer than a few days after delivery.   Chest pain.   Sudden dizziness when you get up from sitting or lying down.   Seizures.   Nausea or vomiting.   Abdominal pain.  How is this diagnosed?  This condition may be diagnosed based on the results of a physical exam, blood pressure measurements, and blood and urine tests.  You may also have other tests, such as a CT scan or an MRI, to check for other problems of postpartum hypertension.  How is this treated?  If blood pressure is high enough to require treatment, your options may include:   Medicines to reduce blood pressure (antihypertensives). Tell your health care provider if you are breastfeeding or if you plan to breastfeed. There are many antihypertensive medicines that are safe to take while breastfeeding.   Stopping medicines that may be causing hypertension.   Treating medical conditions that are causing hypertension.   Treating the complications of hypertension, such as seizures, stroke, or kidney problems.  Your health care provider will also continue to monitor your blood pressure closely until it is within a safe range for you.  Follow these instructions at home:   Take over-the-counter and prescription medicines only as   told by your health care provider.   Return to your normal activities as told by your health care provider. Ask your health care provider what activities are safe for you.   Do not use any products that contain nicotine or tobacco, such as cigarettes and e-cigarettes. If you need help quitting, ask your health care provider.   Keep all follow-up visits as told by your health care provider. This  is important.  Contact a health care provider if:   Your symptoms get worse.   You have new symptoms, such as:  ? A headache that does not get better.  ? Dizziness.  ? Visual changes.  Get help right away if:   You suddenly develop swelling in your hands, ankles, or face.   You have sudden, rapid weight gain.   You develop difficulty breathing, chest pain, racing heartbeat, or heart palpitations.   You develop severe pain in your abdomen.   You have any symptoms of a stroke. "BE FAST" is an easy way to remember the main warning signs of a stroke:  ? B - Balance. Signs are dizziness, sudden trouble walking, or loss of balance.  ? E - Eyes. Signs are trouble seeing or a sudden change in vision.  ? F - Face. Signs are sudden weakness or numbness of the face, or the face or eyelid drooping on one side.  ? A - Arms. Signs are weakness or numbness in an arm. This happens suddenly and usually on one side of the body.  ? S - Speech. Signs are sudden trouble speaking, slurred speech, or trouble understanding what people say.  ? T - Time. Time to call emergency services. Write down what time symptoms started.   You have other signs of a stroke, such as:  ? A sudden, severe headache with no known cause.  ? Nausea or vomiting.  ? Seizure.  These symptoms may represent a serious problem that is an emergency. Do not wait to see if the symptoms will go away. Get medical help right away. Call your local emergency services (911 in the [place] Do not drive yourself to the hospital.  Summary   Postpartum hypertension is high blood pressure that remains higher than normal after childbirth.   In most cases, postpartum hypertension will go away on its own, usually within a week of delivery.   For some women, medical treatment is required to prevent serious complications, such as seizures or stroke.  This information is not intended to replace advice given to you by your health care provider. Make sure you discuss any questions  you have with your health care provider.  Document Released: 12/09/2013 Document Revised: 01/26/2017 Document Reviewed: 01/26/2017  Elsevier Interactive Patient Education  2019 [employer]

## 2018-05-12 NOTE — MAU Provider Note (Signed)
History     CSN: [id number]  Arrival date and time: 05/12/18 1117   First Provider Initiated Contact with Patient 05/12/18 1211      Chief Complaint  Patient presents with  . Hypertension   [name] is a 28 y.o. who presents for Hypertension.  Patient states she had an elevated BP of 184/105 at 0900 and took her 1st dose of labetalol after elevated bp.  She states she was started on Labetalol on Jan 7th and it was increased last week after reporting bp, via phone, back to her office. She reports taking her bp, with an automatic cuff, at home twice a day with pressures ranging from 150-190s/80-100s. She denies HA, blurry vision, double vision, SOB, and edema.       OB History    Gravida  3   Para  2   Term  2   Preterm  0   AB  1   Living  2     SAB  1   TAB  0   Ectopic  0   Multiple  0   Live Births  2           Past Medical History:  Diagnosis Date  . Asthma   . Seasonal allergies     Past Surgical History:  Procedure Laterality Date  . CESAREAN SECTION  03/31/2012   Procedure: CESAREAN SECTION;  Surgeon: Philip Aspen, DO;  Location: [facility] ORS;  Service: Obstetrics;  Laterality: N/A;  . CESAREAN SECTION N/A 03/27/2018   Procedure: CESAREAN SECTION;  Surgeon: Philip Aspen, DO;  Location: [facility] BIRTHING SUITES;  Service: Obstetrics;  Laterality: N/A;  . NO PAST SURGERIES      Family History  Problem Relation Age of Onset  . Diabetes Paternal Grandfather   . Hypertension Maternal Grandmother     Social History   Tobacco Use  . Smoking status: Never Smoker  . Smokeless tobacco: Never Used  Substance Use Topics  . Alcohol use: No  . Drug use: No    Allergies: No Known Allergies  Medications Prior to Admission  Medication Sig Dispense Refill Last Dose  . albuterol (PROVENTIL HFA;VENTOLIN HFA) 108 (90 Base) MCG/ACT inhaler Inhale 2 puffs into the lungs every 6 (six) hours as needed for wheezing or shortness of breath. 1  Inhaler 1 Past Month at Unknown time  . oxyCODONE-acetaminophen (PERCOCET/ROXICET) 5-325 MG tablet Take 2 tablets by mouth every 4 (four) hours as needed for severe pain ((when tolerating fluids)). 30 tablet 0   . Prenatal Multivit-Min-Fe-FA (PRENATAL VITAMINS PO) Take 1 tablet by mouth daily.    Past Week at Unknown time    Review of Systems  Constitutional: Negative for chills and fever.  Respiratory: Negative for shortness of breath.   Cardiovascular: Negative for chest pain.  Gastrointestinal: Negative for nausea and vomiting.  Neurological: Negative for dizziness, light-headedness and headaches.   Physical Exam   Blood pressure (!) 148/85, pulse 78, temperature 98.3 F (36.8 C), temperature source Oral, resp. rate 17, height 5\' 3"  (1.6 m), weight 110.2 kg, SpO2 98 %, unknown if currently breastfeeding.   Vitals:   05/12/18 1138 05/12/18 1201 05/12/18 1216 05/12/18 1230  BP: (!) 163/101 (!) 148/85 (!) 153/85 (!) 153/81  Pulse: 68 78 76 78  Resp: 17     Temp: 98.3 F (36.8 C)     TempSrc: Oral     SpO2: 98%     Weight: 110.2 kg     Height: 5'  3" (1.6 m)        Physical Exam  Constitutional: She is oriented to person, place, and time. She appears well-developed and well-nourished.  Appears fatigued  HENT:  Head: Normocephalic and atraumatic.  Eyes: Conjunctivae are normal.  Neck: Normal range of motion.  Cardiovascular: Normal rate, regular rhythm and normal heart sounds.  Respiratory: Effort normal and breath sounds normal.  GI: Soft. Bowel sounds are normal.  Musculoskeletal: Normal range of motion.        General: No edema.  Neurological: She is alert and oriented to person, place, and time.  Skin: Skin is warm and dry.  Psychiatric: She has a normal mood and affect. Her behavior is normal.    MAU Course  Procedures  MDM Anti-hypertensive readjustment Monitor BP Assessment and Plan  5 weeks PP HTN   -Patient status discussed with Dr. Alysia Penna who  advised: *Give 5mg  Enalapril daily *Discontinue Labetalol  -Will order Enalapril now and monitor  Follow Up (1:47 PM)  Enalapril given and blood pressures trending up -Patient continues to deny HA, visual disturbances, symptoms -Will given PO Procardia per PreEclampsia protocol -Will reassess  Follow Up (3:12 PM)  -Decrease in bp noted after one dose of procardia -Patient status discussed with Dr. Kirtland Bouchard. Newton who advised: *Continue Enalapril daily; Rx written for 5mg  Disp 14, RF 3 *Start Procardia XL 30 daily; Rx written to Disp 14, RF 3 *Follow up in private ob office in 1-2 days -Patient informed of POC and q/c addressed -Encouraged to go home and rest as well as continue to monitor bp -Informed of need for close follow up due to medication changes. -[facility] called and appointment made for Thursday Jan 23 at 1045.  Patient agreeable -Blood pressure precautions given -Encouraged to call private ob or return to MAU if symptoms worsen or with the onset of new symptoms. -Discharged to home in stable condition  Cherre Robins MSN, CNM 05/12/2018, 12:12 PM

## 2018-05-12 NOTE — MAU Note (Signed)
Pt reports elevated b/p at delivery and b/p has remained elevated. Taking labetalol 200mg  TID, b/p 184/105 this am. Denies headache or blurred vision. S/p c-section on 03/27/2018

## 2018-05-12 NOTE — MAU Note (Signed)
Urine sent to lab 

## 2018-07-27 ENCOUNTER — Telehealth: Payer: Self-pay | Admitting: Family Medicine

## 2018-07-27 NOTE — Telephone Encounter (Signed)
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

## 2018-07-27 NOTE — Telephone Encounter (Signed)
Please call her. I understand her concerns. She works for [employer]? It is my understanding that only pregnant females are at an increased risk of getting sicker if they were to become infected and that only pregnant women are being relocated to other roles and away from the front line. I am not familiar with a policy regarding women who are nursing.  She can certainly check with HR to make sure, but I have not received any information about lactating women and Covid-19. The CDC reports that in some studies the virus has not been detected in breast milk of nursing mothers but this is early testing still.  I recommend that she use good hand hygiene and can even wear a mask when breastfeeding if she would like to be extra careful.

## 2018-07-27 NOTE — Telephone Encounter (Signed)
Pt was notified.  

## 2019-03-10 ENCOUNTER — Ambulatory Visit: Payer: Managed Care, Other (non HMO) | Admitting: Family Medicine

## 2019-03-10 ENCOUNTER — Encounter: Payer: Self-pay | Admitting: Family Medicine

## 2019-03-10 ENCOUNTER — Ambulatory Visit (INDEPENDENT_AMBULATORY_CARE_PROVIDER_SITE_OTHER): Payer: Medicaid Other | Admitting: Family Medicine

## 2019-03-10 ENCOUNTER — Other Ambulatory Visit: Payer: Self-pay

## 2019-03-10 VITALS — BP 160/94 | HR 94 | Temp 97.7°F | Wt 249.2 lb

## 2019-03-10 DIAGNOSIS — I10 Essential (primary) hypertension: Secondary | ICD-10-CM | POA: Diagnosis not present

## 2019-03-10 DIAGNOSIS — Z131 Encounter for screening for diabetes mellitus: Secondary | ICD-10-CM

## 2019-03-10 DIAGNOSIS — J4520 Mild intermittent asthma, uncomplicated: Secondary | ICD-10-CM

## 2019-03-10 DIAGNOSIS — F17210 Nicotine dependence, cigarettes, uncomplicated: Secondary | ICD-10-CM | POA: Diagnosis not present

## 2019-03-10 DIAGNOSIS — Z13220 Encounter for screening for lipoid disorders: Secondary | ICD-10-CM

## 2019-03-10 LAB — POCT URINALYSIS DIP (PROADVANTAGE DEVICE)
Glucose, UA: NEGATIVE mg/dL
Ketones, POC UA: NEGATIVE mg/dL
Leukocytes, UA: NEGATIVE
Nitrite, UA: NEGATIVE
Specific Gravity, Urine: 1.03
Urobilinogen, Ur: NEGATIVE
pH, UA: 6 (ref 5.0–8.0)

## 2019-03-10 MED ORDER — AMLODIPINE BESYLATE 5 MG PO TABS: 5.0000 mg | ORAL_TABLET | Freq: Every day | ORAL | 1 refills | Status: DC

## 2019-03-10 MED ORDER — ALBUTEROL SULFATE HFA 108 (90 BASE) MCG/ACT IN AERS: 2.0000 | INHALATION_SPRAY | Freq: Four times a day (QID) | RESPIRATORY_TRACT | 0 refills | Status: AC | PRN

## 2019-03-10 NOTE — Progress Notes (Signed)
   Subjective:    Patient ID: [name], female    DOB: Aug 29, 1990, 29 y.o.   MRN: [id number]  HPI Chief Complaint  Patient presents with  . bp issues    bp issues- was high after pregnancy and on meds and then came off med and 2-3 weeks ago it was 159/100   Here with concerns regarding elevated BP for the past 3 weeks. BP elevated during the delivery of her child approximately 12 months ago and post partum. States she was on medication for a couple of months post partum but then her BP "leveled off" and she stopped the medications.   She has not been checking BP until 3 weeks ago and it has been elevated since.   Denies fever, chills, dizziness, tinnitus, blurred or double vision, chest pain, palpitations, shortness of breath, abdominal pain, N/V/D, urinary symptoms, LE edema.   States she has intermittent headaches and sees "black dots". Has not had an eye exam recently.   Asthma- controlled. Requests refill on albuterol inhaler. Does not use this often but last one expired  On OCPs and not breastfeeding.   Does not want to take an Ace inbitor, father had angioedema with this.   No sign of sleep apnea.   Smoking black and mild cigar, has only one per day while driving and wants to stop   Reviewed allergies, medications, past medical, surgical, family, and social history.    Review of Systems Pertinent positives and negatives in the history of present illness.     Objective:   Physical Exam BP (!) 160/94   Pulse 94   Temp 97.7 F (36.5 C)   Wt 249 lb 3.2 oz (113 kg)   LMP 02/08/2019   Breastfeeding No   BMI 44.14 kg/m   Alert and in no distress. Tympanic membranes and canals are normal. Neck is supple without adenopathy or thyromegaly. Cardiac exam shows a regular sinus rhythm without murmurs or gallops. Lungs are clear to auscultation. Extremities without edema. DTRs symmetric and normal      Assessment & Plan:  Essential hypertension - Plan: CBC with  Differential, Comprehensive metabolic panel, POCT Urinalysis DIP (Proadvantage Device), amLODipine (NORVASC) 5 MG tablet  Morbid obesity (HCC) - Plan: Hemoglobin A1c, Lipid Panel  Light smoker  Mild intermittent asthma without complication  Screening for lipid disorders - Plan: Lipid Panel  Screening for diabetes mellitus - Plan: Hemoglobin A1c  Encouraged low sodium diet and healthy lifestyle to help control HTN. She will start on amlodipine 5 mg. She declined being on an ace inhibitor since her father had an allergic reaction in the past. Recommend daily BP checks and follow up via phone in 2 weeks with readings and in 4 weeks. Counseling on weight loss.  Counseling on smoking cessation. She is only smoking one black and mild per day. Recommend substituting a healthy replacement.  Asthma well controlled.  Screen for risk factors for heart disease.  Follow up pending labs and in 4 weeks. Recheck urine in 4 weeks. Hematuria but on cycle.

## 2019-03-10 NOTE — Patient Instructions (Signed)
Start taking the amlodipine 5 mg once daily.  Keep an eye on your blood pressure.  Goal blood pressure reading is less than 130/80. Cut back your sodium intake to no more than 2,300 mg/day.  Have your game plan as to how you will stop smoking.  Substitute a healthy behavior for the black and mild.  You can call or send a MyChart message with your blood pressure readings in 2 weeks.  Follow-up with me either in office or virtually in 4 weeks.    DASH Eating Plan DASH stands for "Dietary Approaches to Stop Hypertension." The DASH eating plan is a healthy eating plan that has been shown to reduce high blood pressure (hypertension). It may also reduce your risk for type 2 diabetes, heart disease, and stroke. The DASH eating plan may also help with weight loss. What are tips for following this plan?  General guidelines  Avoid eating more than 2,300 mg (milligrams) of salt (sodium) a day. If you have hypertension, you may need to reduce your sodium intake to 1,500 mg a day.  Limit alcohol intake to no more than 1 drink a day for nonpregnant women and 2 drinks a day for men. One drink equals 12 oz of beer, 5 oz of wine, or 1 oz of hard liquor.  Work with your health care provider to maintain a healthy body weight or to lose weight. Ask what an ideal weight is for you.  Get at least 30 minutes of exercise that causes your heart to beat faster (aerobic exercise) most days of the week. Activities may include walking, swimming, or biking.  Work with your health care provider or diet and nutrition specialist (dietitian) to adjust your eating plan to your individual calorie needs. Reading food labels   Check food labels for the amount of sodium per serving. Choose foods with less than 5 percent of the Daily Value of sodium. Generally, foods with less than 300 mg of sodium per serving fit into this eating plan.  To find whole grains, look for the word "whole" as the first word in the ingredient list.  Shopping  Buy products labeled as "low-sodium" or "no salt added."  Buy fresh foods. Avoid canned foods and premade or frozen meals. Cooking  Avoid adding salt when cooking. Use salt-free seasonings or herbs instead of table salt or sea salt. Check with your health care provider or pharmacist before using salt substitutes.  Do not fry foods. Cook foods using healthy methods such as baking, boiling, grilling, and broiling instead.  Cook with heart-healthy oils, such as olive, canola, soybean, or sunflower oil. Meal planning  Eat a balanced diet that includes: ? 5 or more servings of fruits and vegetables each day. At each meal, try to fill half of your plate with fruits and vegetables. ? Up to 6-8 servings of whole grains each day. ? Less than 6 oz of lean meat, poultry, or fish each day. A 3-oz serving of meat is about the same size as a deck of cards. One egg equals 1 oz. ? 2 servings of low-fat dairy each day. ? A serving of nuts, seeds, or beans 5 times each week. ? Heart-healthy fats. Healthy fats called Omega-3 fatty acids are found in foods such as flaxseeds and coldwater fish, like sardines, salmon, and mackerel.  Limit how much you eat of the following: ? Canned or prepackaged foods. ? Food that is high in trans fat, such as fried foods. ? Food that is high  in saturated fat, such as fatty meat. ? Sweets, desserts, sugary drinks, and other foods with added sugar. ? Full-fat dairy products.  Do not salt foods before eating.  Try to eat at least 2 vegetarian meals each week.  Eat more home-cooked food and less restaurant, buffet, and fast food.  When eating at a restaurant, ask that your food be prepared with less salt or no salt, if possible. What foods are recommended? The items listed may not be a complete list. Talk with your dietitian about what dietary choices are best for you. Grains Whole-grain or whole-wheat bread. Whole-grain or whole-wheat pasta. Brown rice.  Orpah Cobb. Bulgur. Whole-grain and low-sodium cereals. Pita bread. Low-fat, low-sodium crackers. Whole-wheat flour tortillas. Vegetables Fresh or frozen vegetables (raw, steamed, roasted, or grilled). Low-sodium or reduced-sodium tomato and vegetable juice. Low-sodium or reduced-sodium tomato sauce and tomato paste. Low-sodium or reduced-sodium canned vegetables. Fruits All fresh, dried, or frozen fruit. Canned fruit in natural juice (without added sugar). Meat and other protein foods Skinless chicken or [place]. Ground chicken or [place]. Pork with fat trimmed off. Fish and seafood. Egg whites. Dried beans, peas, or lentils. Unsalted nuts, nut butters, and seeds. Unsalted canned beans. Lean cuts of beef with fat trimmed off. Low-sodium, lean deli meat. Dairy Low-fat (1%) or fat-free (skim) milk. Fat-free, low-fat, or reduced-fat cheeses. Nonfat, low-sodium ricotta or cottage cheese. Low-fat or nonfat yogurt. Low-fat, low-sodium cheese. Fats and oils Soft margarine without trans fats. Vegetable oil. Low-fat, reduced-fat, or light mayonnaise and salad dressings (reduced-sodium). Canola, safflower, olive, soybean, and sunflower oils. Avocado. Seasoning and other foods Herbs. Spices. Seasoning mixes without salt. Unsalted popcorn and pretzels. Fat-free sweets. What foods are not recommended? The items listed may not be a complete list. Talk with your dietitian about what dietary choices are best for you. Grains Baked goods made with fat, such as croissants, muffins, or some breads. Dry pasta or rice meal packs. Vegetables Creamed or fried vegetables. Vegetables in a cheese sauce. Regular canned vegetables (not low-sodium or reduced-sodium). Regular canned tomato sauce and paste (not low-sodium or reduced-sodium). Regular tomato and vegetable juice (not low-sodium or reduced-sodium). Rosita Fire. Olives. Fruits Canned fruit in a light or heavy syrup. Fried fruit. Fruit in cream or butter sauce. Meat  and other protein foods Fatty cuts of meat. Ribs. Fried meat. Tomasa Blase. Sausage. Bologna and other processed lunch meats. Salami. Fatback. Hotdogs. Bratwurst. Salted nuts and seeds. Canned beans with added salt. Canned or smoked fish. Whole eggs or egg yolks. Chicken or [place] with skin. Dairy Whole or 2% milk, cream, and half-and-half. Whole or full-fat cream cheese. Whole-fat or sweetened yogurt. Full-fat cheese. Nondairy creamers. Whipped toppings. Processed cheese and cheese spreads. Fats and oils Butter. Stick margarine. Lard. Shortening. Ghee. Bacon fat. Tropical oils, such as coconut, palm kernel, or palm oil. Seasoning and other foods Salted popcorn and pretzels. Onion salt, garlic salt, seasoned salt, table salt, and sea salt. Worcestershire sauce. Tartar sauce. Barbecue sauce. Teriyaki sauce. Soy sauce, including reduced-sodium. Steak sauce. Canned and packaged gravies. Fish sauce. Oyster sauce. Cocktail sauce. Horseradish that you find on the shelf. Ketchup. Mustard. Meat flavorings and tenderizers. Bouillon cubes. Hot sauce and Tabasco sauce. Premade or packaged marinades. Premade or packaged taco seasonings. Relishes. Regular salad dressings. Where to find more information:  National Heart, Lung, and Blood Institute: PopSteam.is  American Heart Association: www.heart.org Summary  The DASH eating plan is a healthy eating plan that has been shown to reduce high blood pressure (hypertension). It may  also reduce your risk for type 2 diabetes, heart disease, and stroke.  With the DASH eating plan, you should limit salt (sodium) intake to 2,300 mg a day. If you have hypertension, you may need to reduce your sodium intake to 1,500 mg a day.  When on the DASH eating plan, aim to eat more fresh fruits and vegetables, whole grains, lean proteins, low-fat dairy, and heart-healthy fats.  Work with your health care provider or diet and nutrition specialist (dietitian) to adjust your eating  plan to your individual calorie needs. This information is not intended to replace advice given to you by your health care provider. Make sure you discuss any questions you have with your health care provider. Document Released: 03/27/2011 Document Revised: 03/20/2017 Document Reviewed: 03/31/2016 Elsevier Patient Education  2020 [employer]

## 2019-03-11 ENCOUNTER — Encounter: Payer: Self-pay | Admitting: Family Medicine

## 2019-03-11 DIAGNOSIS — E786 Lipoprotein deficiency: Secondary | ICD-10-CM

## 2019-03-11 DIAGNOSIS — I10 Essential (primary) hypertension: Secondary | ICD-10-CM | POA: Insufficient documentation

## 2019-03-11 HISTORY — DX: Essential (primary) hypertension: I10

## 2019-03-11 HISTORY — DX: Lipoprotein deficiency: E78.6

## 2019-03-11 HISTORY — DX: Morbid (severe) obesity due to excess calories: E66.01

## 2019-03-11 LAB — COMPREHENSIVE METABOLIC PANEL
ALT: 9 IU/L (ref 0–32)
AST: 11 IU/L (ref 0–40)
Albumin/Globulin Ratio: 1.5 (ref 1.2–2.2)
Albumin: 4.2 g/dL (ref 3.9–5.0)
Alkaline Phosphatase: 98 IU/L (ref 39–117)
BUN/Creatinine Ratio: 14 (ref 9–23)
BUN: 10 mg/dL (ref 6–20)
Bilirubin Total: 0.3 mg/dL (ref 0.0–1.2)
CO2: 22 mmol/L (ref 20–29)
Calcium: 9.1 mg/dL (ref 8.7–10.2)
Chloride: 103 mmol/L (ref 96–106)
Creatinine, Ser: 0.74 mg/dL (ref 0.57–1.00)
GFR calc Af Amer: 127 mL/min/{1.73_m2} (ref >59)
GFR calc non Af Amer: 110 mL/min/{1.73_m2} (ref >59)
Globulin, Total: 2.8 g/dL (ref 1.5–4.5)
Glucose: 125 mg/dL — ABNORMAL HIGH (ref 65–99)
Potassium: 4.1 mmol/L (ref 3.5–5.2)
Sodium: 141 mmol/L (ref 134–144)
Total Protein: 7 g/dL (ref 6.0–8.5)

## 2019-03-11 LAB — CBC WITH DIFFERENTIAL/PLATELET
Basophils Absolute: 0 10*3/uL (ref 0.0–0.2)
Basos: 0 %
EOS (ABSOLUTE): 0.1 10*3/uL (ref 0.0–0.4)
Eos: 2 %
Hematocrit: 34.5 % (ref 34.0–46.6)
Hemoglobin: 11.6 g/dL (ref 11.1–15.9)
Immature Grans (Abs): 0 10*3/uL (ref 0.0–0.1)
Immature Granulocytes: 0 %
Lymphocytes Absolute: 2 10*3/uL (ref 0.7–3.1)
Lymphs: 30 %
MCH: 29.7 pg (ref 26.6–33.0)
MCHC: 33.6 g/dL (ref 31.5–35.7)
MCV: 89 fL (ref 79–97)
Monocytes Absolute: 0.3 10*3/uL (ref 0.1–0.9)
Monocytes: 4 %
Neutrophils Absolute: 4.3 10*3/uL (ref 1.4–7.0)
Neutrophils: 64 %
Platelets: 238 10*3/uL (ref 150–450)
RBC: 3.9 x10E6/uL (ref 3.77–5.28)
RDW: 12.5 % (ref 11.7–15.4)
WBC: 6.8 10*3/uL (ref 3.4–10.8)

## 2019-03-11 LAB — LIPID PANEL
Chol/HDL Ratio: 3 ratio (ref 0.0–4.4)
Cholesterol, Total: 116 mg/dL (ref 100–199)
HDL: 39 mg/dL — ABNORMAL LOW (ref >39)
LDL Chol Calc (NIH): 61 mg/dL (ref 0–99)
Triglycerides: 82 mg/dL (ref 0–149)
VLDL Cholesterol Cal: 16 mg/dL (ref 5–40)

## 2019-03-11 LAB — HEMOGLOBIN A1C
Est. average glucose Bld gHb Est-mCnc: 97 mg/dL
Hgb A1c MFr Bld: 5 % (ref 4.8–5.6)

## 2019-04-05 ENCOUNTER — Telehealth: Payer: Self-pay | Admitting: Family Medicine

## 2019-04-05 NOTE — Telephone Encounter (Signed)
She should follow up 4 weeks from her previous visit. I would like for her to bring in her BP machine and readings. Ok to give her 3 months of Sprintec. I have not filled this for her in the past and we can discuss it at her follow up.

## 2019-04-05 NOTE — Telephone Encounter (Signed)
Pt called and needs a refill on Sprintec sent to the [facility] on [address]. She also wants to know when do you want her to come back in for a blood pressure check

## 2019-04-06 ENCOUNTER — Other Ambulatory Visit: Payer: Self-pay | Admitting: Internal Medicine

## 2019-04-06 MED ORDER — NORGESTIMATE-ETH ESTRADIOL 0.25-35 MG-MCG PO TABS: 1.0000 | ORAL_TABLET | Freq: Every day | ORAL | 0 refills | Status: DC

## 2019-04-06 NOTE — Telephone Encounter (Signed)
ok 

## 2019-04-06 NOTE — Telephone Encounter (Signed)
Pt states that she has been checking her bp at work as her bp machine at home died. She wants to know if she can do a virtual since she can't bring a bp machine in. She doesn't have time she can get off work to come in

## 2019-04-06 NOTE — Telephone Encounter (Signed)
We need to check her BP and I am ok if she only has time to come by for a nurse visit to check her BP. I prefer to see her myself but at least we can see if the medication is working.

## 2019-04-06 NOTE — Telephone Encounter (Signed)
Pt can not come in due to work  schedule but has been checking her bp at work. She will do a virtual next week and will have her reading with her.

## 2019-04-13 ENCOUNTER — Ambulatory Visit (INDEPENDENT_AMBULATORY_CARE_PROVIDER_SITE_OTHER): Payer: Medicaid Other | Admitting: Family Medicine

## 2019-04-13 ENCOUNTER — Encounter: Payer: Self-pay | Admitting: Family Medicine

## 2019-04-13 ENCOUNTER — Other Ambulatory Visit: Payer: Self-pay

## 2019-04-13 VITALS — BP 150/69 | Wt 245.0 lb

## 2019-04-13 DIAGNOSIS — I10 Essential (primary) hypertension: Secondary | ICD-10-CM

## 2019-04-13 MED ORDER — AMLODIPINE BESYLATE 10 MG PO TABS: 10.0000 mg | ORAL_TABLET | Freq: Every day | ORAL | 1 refills | Status: DC

## 2019-04-13 NOTE — Progress Notes (Signed)
   Subjective:  Documentation for virtual audio and video telecommunications through [facility] encounter:  The patient was located at work. 2 patient identifiers used.  The provider was located in the office. The patient did consent to this visit and is aware of possible charges through their insurance for this visit.  The other persons participating in this telemedicine service were none.    Patient ID: [name], female    DOB: [birth year], 28 y.o.   MRN: [id number]  HPI Chief Complaint  Patient presents with  . follow-up    follow-up on bp. bp ranging 151-164/80-86   This is a virtual 4-week follow up on HTN.  She was here approximately 1 month ago due to elevated blood pressure readings since her last pregnancy and giving birth.  States she was on medication near the end of the pregnancy and shortly after giving birth and reportedly stopped the medications due to her blood pressure becoming more normal.  It sounds like she was on 2 medications at that time.  Started her on amlodipine 5 mg 1 month ago.  She reports no side effects and taking this on a daily basis.  States her blood pressures have improved some but she is still having systolic readings in the 409W.  Diastolic readings have been between 60 and 80.  She is concerned that her blood pressures are not yet well controlled.  Reports eating a lower sodium diet.  States she is taking OCPs and no plans for pregnancy in the next couple of years.   No sign of sleep apnea per screening questions at her previous visit.  Denies fever, chills, dizziness, headaches, chest pain, palpitations, shortness of breath, lower extremity edema.  Reviewed allergies, medications, past medical, surgical, family, and social history.    Review of Systems Pertinent positives and negatives in the history of present illness.     Objective:   Physical Exam BP (!) 150/69   Wt 245 lb (111.1 kg)   Breastfeeding No   BMI 43.40 kg/m     Alert and oriented and in no acute distress.  Normal speech, mood and thought process.  Unable to examine further due to virtual visit      Assessment & Plan:  Essential hypertension - Plan: amLODipine (NORVASC) 10 MG tablet  Some improvement with the amlodipine 5 mg however not yet to goal.  She has made healthy dietary changes by limiting sodium.  She denies any side effects with the amlodipine.  We discussed treatment options to help lower her blood pressure including increasing her amlodipine to 10 mg or adding a second hypertensive agent such as an ARB or diuretic.  She does not want to take an ACE inhibitor due to her father having angioedema from this.  She has never had an adverse reaction to an ACE inhibitor.  She prefers to increase amlodipine to 10 mg and see how this does.  We will follow-up in 4 weeks approximately an office.  She will call to schedule his appointment.  Time spent on call was  12 minutes and in review of previous records 15 minutes total.  This virtual service is not related to other E/M service within previous 7 days.

## 2019-06-01 ENCOUNTER — Ambulatory Visit (INDEPENDENT_AMBULATORY_CARE_PROVIDER_SITE_OTHER): Payer: Medicaid Other | Admitting: Family Medicine

## 2019-06-01 ENCOUNTER — Other Ambulatory Visit: Payer: Self-pay

## 2019-06-01 ENCOUNTER — Encounter: Payer: Self-pay | Admitting: Family Medicine

## 2019-06-01 VITALS — BP 132/82 | HR 87 | Temp 98.0°F | Wt 245.0 lb

## 2019-06-01 DIAGNOSIS — I10 Essential (primary) hypertension: Secondary | ICD-10-CM | POA: Diagnosis not present

## 2019-06-01 DIAGNOSIS — Z3041 Encounter for surveillance of contraceptive pills: Secondary | ICD-10-CM

## 2019-06-01 DIAGNOSIS — Z87448 Personal history of other diseases of urinary system: Secondary | ICD-10-CM | POA: Diagnosis not present

## 2019-06-01 LAB — POCT URINALYSIS DIP (PROADVANTAGE DEVICE)
Bilirubin, UA: NEGATIVE
Blood, UA: NEGATIVE
Glucose, UA: NEGATIVE mg/dL
Ketones, POC UA: NEGATIVE mg/dL
Leukocytes, UA: NEGATIVE
Nitrite, UA: NEGATIVE
Protein Ur, POC: NEGATIVE mg/dL
Specific Gravity, Urine: 1.015
Urobilinogen, Ur: NEGATIVE
pH, UA: 7 (ref 5.0–8.0)

## 2019-06-01 MED ORDER — NORGESTIMATE-ETH ESTRADIOL 0.25-35 MG-MCG PO TABS: 1.0000 | ORAL_TABLET | Freq: Every day | ORAL | 0 refills | Status: AC

## 2019-06-01 MED ORDER — AMLODIPINE BESYLATE 10 MG PO TABS: 10.0000 mg | ORAL_TABLET | Freq: Every day | ORAL | 0 refills | Status: AC

## 2019-06-01 NOTE — Progress Notes (Signed)
   Subjective:    Patient ID: [name], female    DOB: 1991/02/05, 29 y.o.   MRN: [id number]  HPI Chief Complaint  Patient presents with  . follow-up    follow-up on bp 140-145/70   Here for a 4 week follow up on HTN.   States she checks her blood pressure at home with a wrist cuff and it has been in the 140s over 70 range.  Reports taking amlodipine 10 mg daily without any side effects. States she has cut back on sodium and has increased her physical activity.  States she is moving to [place] the end of February.  Requests a 90-day refill on her blood pressure medication until she can establish with a PCP in [place].  She also request a 27-month refill of her birth control pills.  No new concerns today.  Denies fever, chills, dizziness, headache, chest pain, palpitations, shortness of breath, nausea, vomiting, diarrhea, lower extremity edema.    Discussed that her previous visit 2 months ago showed hematuria, bilirubin and a trace of protein in her urine.  States she was on her period at that time.   Review of Systems Pertinent positives and negatives in the history of present illness.     Objective:   Physical Exam BP 132/82   Pulse 87   Temp 98 F (36.7 C)   Wt 245 lb (111.1 kg)   SpO2 99%   Breastfeeding No   BMI 43.40 kg/m   Alert and oriented and in no acute distress.  Not otherwise examined.      Assessment & Plan:  Essential hypertension - Plan: amLODipine (NORVASC) 10 MG tablet  History of hematuria - Plan: POCT Urinalysis DIP (Proadvantage Device)  Encounter for birth control pills maintenance - Plan: norgestimate-ethinyl estradiol (SPRINTEC 28) 0.25-35 MG-MCG tablet  Her blood pressure today is very close to goal range and I see no reason to change her blood pressure medication.  I will give her a 90-day refill of amlodipine.  Recommend she continue paying close attention to her sodium and getting adequate exercise. Recommend that she  get a blood pressure cuff for her upper arm and continue keeping a close eye on her blood pressure. Refilled birth control pills for 3 months. Repeat urinalysis is normal today. Wished her luck with her new move to [place].

## 2019-06-16 ENCOUNTER — Telehealth: Payer: Self-pay | Admitting: Family Medicine

## 2019-06-16 NOTE — Telephone Encounter (Signed)
Pt called and states that she has a history of yeast infections that has been discussed with Vickie. She now has one and would like something sent in for her. She is leaving to go out of town first thing in the morning so would like this addressed today. Pt can be reached at [phone].

## 2019-06-16 NOTE — Telephone Encounter (Signed)
Pt states she was moving today and couldn't come in

## 2019-06-16 NOTE — Telephone Encounter (Signed)
She will need a visit to discuss symptoms and to be tested for yeast and then we can treat her symptoms and findings. Sometimes we find bacterial vaginosis along with yeast or instead of and this requires a different medication to clear it.
